# Patient Record
Sex: Male | Born: 1935 | Race: Black or African American | Hispanic: No | Marital: Married
Health system: Southern US, Community
[De-identification: ages and names within clinical notes are randomized; demographics above are authoritative.]

---

## 2005-07-20 ENCOUNTER — Inpatient Hospital Stay (HOSPITAL_COMMUNITY): Admission: EM | Admit: 2005-07-20 | Discharge: 2005-07-30 | Payer: Self-pay | Admitting: Emergency Medicine

## 2005-07-21 ENCOUNTER — Encounter (INDEPENDENT_AMBULATORY_CARE_PROVIDER_SITE_OTHER): Payer: Self-pay | Admitting: Cardiology

## 2005-10-12 ENCOUNTER — Encounter (INDEPENDENT_AMBULATORY_CARE_PROVIDER_SITE_OTHER): Payer: Self-pay | Admitting: Specialist

## 2005-10-12 ENCOUNTER — Ambulatory Visit (HOSPITAL_COMMUNITY): Admission: RE | Admit: 2005-10-12 | Discharge: 2005-10-12 | Payer: Self-pay | Admitting: Gastroenterology

## 2005-12-12 ENCOUNTER — Encounter: Admission: RE | Admit: 2005-12-12 | Discharge: 2005-12-12 | Payer: Self-pay | Admitting: Internal Medicine

## 2006-03-08 ENCOUNTER — Encounter: Admission: RE | Admit: 2006-03-08 | Discharge: 2006-03-08 | Payer: Self-pay | Admitting: Internal Medicine

## 2006-07-22 ENCOUNTER — Encounter: Payer: Self-pay | Admitting: Orthopedic Surgery

## 2006-08-09 ENCOUNTER — Encounter: Payer: Self-pay | Admitting: Orthopedic Surgery

## 2006-10-26 ENCOUNTER — Emergency Department: Payer: Self-pay | Admitting: Emergency Medicine

## 2006-11-30 ENCOUNTER — Emergency Department (HOSPITAL_COMMUNITY): Admission: EM | Admit: 2006-11-30 | Discharge: 2006-11-30 | Payer: Self-pay | Admitting: Emergency Medicine

## 2006-12-09 ENCOUNTER — Encounter (HOSPITAL_COMMUNITY): Admission: RE | Admit: 2006-12-09 | Discharge: 2007-01-20 | Payer: Self-pay | Admitting: Nephrology

## 2007-01-10 ENCOUNTER — Ambulatory Visit: Payer: Self-pay

## 2007-08-13 ENCOUNTER — Encounter (HOSPITAL_COMMUNITY): Admission: RE | Admit: 2007-08-13 | Discharge: 2007-10-01 | Payer: Self-pay | Admitting: Nephrology

## 2008-04-14 ENCOUNTER — Encounter (HOSPITAL_COMMUNITY): Admission: RE | Admit: 2008-04-14 | Discharge: 2008-07-13 | Payer: Self-pay | Admitting: Nephrology

## 2008-07-13 ENCOUNTER — Encounter (HOSPITAL_COMMUNITY): Admission: RE | Admit: 2008-07-13 | Discharge: 2008-10-11 | Payer: Self-pay | Admitting: Nephrology

## 2008-07-17 ENCOUNTER — Emergency Department: Payer: Self-pay | Admitting: Emergency Medicine

## 2008-10-21 ENCOUNTER — Encounter (HOSPITAL_COMMUNITY): Admission: RE | Admit: 2008-10-21 | Discharge: 2009-01-19 | Payer: Self-pay | Admitting: Nephrology

## 2009-02-24 ENCOUNTER — Encounter (HOSPITAL_COMMUNITY): Admission: RE | Admit: 2009-02-24 | Discharge: 2009-05-12 | Payer: Self-pay | Admitting: Nephrology

## 2009-05-09 ENCOUNTER — Ambulatory Visit: Payer: Self-pay | Admitting: Urology

## 2009-05-17 ENCOUNTER — Ambulatory Visit: Payer: Self-pay | Admitting: Urology

## 2009-06-02 ENCOUNTER — Encounter (HOSPITAL_COMMUNITY): Admission: RE | Admit: 2009-06-02 | Discharge: 2009-08-31 | Payer: Self-pay | Admitting: Nephrology

## 2009-06-08 ENCOUNTER — Ambulatory Visit: Payer: Self-pay | Admitting: Oncology

## 2009-06-10 ENCOUNTER — Ambulatory Visit: Payer: Self-pay | Admitting: Urology

## 2009-06-13 ENCOUNTER — Ambulatory Visit: Payer: Self-pay | Admitting: Oncology

## 2009-06-16 ENCOUNTER — Ambulatory Visit: Payer: Self-pay | Admitting: Oncology

## 2009-07-08 ENCOUNTER — Ambulatory Visit: Payer: Self-pay | Admitting: Oncology

## 2009-08-08 ENCOUNTER — Ambulatory Visit: Payer: Self-pay | Admitting: Oncology

## 2009-09-02 ENCOUNTER — Encounter (HOSPITAL_COMMUNITY): Admission: RE | Admit: 2009-09-02 | Discharge: 2009-12-01 | Payer: Self-pay | Admitting: Nephrology

## 2009-09-08 ENCOUNTER — Ambulatory Visit: Payer: Self-pay | Admitting: Oncology

## 2009-09-27 ENCOUNTER — Ambulatory Visit: Payer: Self-pay | Admitting: Oncology

## 2009-10-08 ENCOUNTER — Ambulatory Visit: Payer: Self-pay | Admitting: Oncology

## 2009-11-08 ENCOUNTER — Ambulatory Visit: Payer: Self-pay | Admitting: Oncology

## 2009-12-02 ENCOUNTER — Encounter (HOSPITAL_COMMUNITY)
Admission: RE | Admit: 2009-12-02 | Discharge: 2010-02-07 | Payer: Self-pay | Source: Home / Self Care | Attending: Nephrology | Admitting: Nephrology

## 2009-12-08 ENCOUNTER — Ambulatory Visit: Payer: Self-pay | Admitting: Oncology

## 2010-01-08 ENCOUNTER — Ambulatory Visit: Payer: Self-pay | Admitting: Oncology

## 2010-01-13 LAB — POCT HEMOGLOBIN-HEMACUE
Hemoglobin: 8.8 g/dL — ABNORMAL LOW (ref 13.0–17.0)
Hemoglobin: 8.8 g/dL — ABNORMAL LOW (ref 13.0–17.0)

## 2010-01-13 LAB — FERRITIN: Ferritin: 1294 ng/mL — ABNORMAL HIGH (ref 22–322)

## 2010-01-28 ENCOUNTER — Encounter: Payer: Self-pay | Admitting: Gastroenterology

## 2010-01-31 LAB — POCT HEMOGLOBIN-HEMACUE: Hemoglobin: 8.8 g/dL — ABNORMAL LOW (ref 13.0–17.0)

## 2010-02-03 LAB — POCT HEMOGLOBIN-HEMACUE: Hemoglobin: 9.7 g/dL — ABNORMAL LOW (ref 13.0–17.0)

## 2010-02-03 LAB — IRON AND TIBC
Iron: 53 ug/dL (ref 42–135)
Saturation Ratios: 27 % (ref 20–55)
TIBC: 193 ug/dL — ABNORMAL LOW (ref 215–435)
UIBC: 140 ug/dL

## 2010-02-07 LAB — FERRITIN: Ferritin: 1222 ng/mL — ABNORMAL HIGH (ref 22–322)

## 2010-02-08 ENCOUNTER — Ambulatory Visit: Payer: Self-pay | Admitting: Oncology

## 2010-02-10 ENCOUNTER — Encounter (HOSPITAL_COMMUNITY): Payer: Medicare Other | Attending: Nephrology

## 2010-02-10 ENCOUNTER — Other Ambulatory Visit: Payer: Self-pay

## 2010-02-10 DIAGNOSIS — N183 Chronic kidney disease, stage 3 unspecified: Secondary | ICD-10-CM | POA: Insufficient documentation

## 2010-02-10 DIAGNOSIS — D638 Anemia in other chronic diseases classified elsewhere: Secondary | ICD-10-CM | POA: Insufficient documentation

## 2010-02-16 LAB — POCT HEMOGLOBIN-HEMACUE: Hemoglobin: 9.7 g/dL — ABNORMAL LOW (ref 13.0–17.0)

## 2010-02-17 ENCOUNTER — Other Ambulatory Visit: Payer: Self-pay

## 2010-02-17 ENCOUNTER — Encounter (HOSPITAL_COMMUNITY): Payer: MEDICARE

## 2010-02-23 LAB — POCT HEMOGLOBIN-HEMACUE: Hemoglobin: 10.8 g/dL — ABNORMAL LOW (ref 13.0–17.0)

## 2010-02-24 ENCOUNTER — Other Ambulatory Visit: Payer: Self-pay

## 2010-02-24 ENCOUNTER — Encounter (HOSPITAL_COMMUNITY): Payer: Medicare Other

## 2010-02-27 LAB — POCT HEMOGLOBIN-HEMACUE: Hemoglobin: 11.4 g/dL — ABNORMAL LOW (ref 13.0–17.0)

## 2010-03-03 ENCOUNTER — Other Ambulatory Visit: Payer: Self-pay | Admitting: Nephrology

## 2010-03-03 ENCOUNTER — Encounter (HOSPITAL_COMMUNITY): Payer: Medicare Other

## 2010-03-03 ENCOUNTER — Other Ambulatory Visit: Payer: Self-pay

## 2010-03-03 LAB — IRON AND TIBC
Iron: 49 ug/dL (ref 42–135)
Saturation Ratios: 24 % (ref 20–55)
TIBC: 202 ug/dL — ABNORMAL LOW (ref 215–435)
UIBC: 153 ug/dL

## 2010-03-06 LAB — POCT HEMOGLOBIN-HEMACUE: Hemoglobin: 11.5 g/dL — ABNORMAL LOW (ref 13.0–17.0)

## 2010-03-09 ENCOUNTER — Encounter (HOSPITAL_COMMUNITY): Payer: Medicare Other | Attending: Nephrology

## 2010-03-09 ENCOUNTER — Other Ambulatory Visit: Payer: Self-pay

## 2010-03-09 DIAGNOSIS — N183 Chronic kidney disease, stage 3 unspecified: Secondary | ICD-10-CM | POA: Insufficient documentation

## 2010-03-09 DIAGNOSIS — D638 Anemia in other chronic diseases classified elsewhere: Secondary | ICD-10-CM | POA: Insufficient documentation

## 2010-03-17 ENCOUNTER — Encounter (HOSPITAL_COMMUNITY): Payer: Medicare Other

## 2010-03-17 ENCOUNTER — Other Ambulatory Visit: Payer: Self-pay

## 2010-03-20 LAB — IRON AND TIBC
Iron: 67 ug/dL (ref 42–135)
Saturation Ratios: 34 % (ref 20–55)
UIBC: 132 ug/dL

## 2010-03-20 LAB — POCT HEMOGLOBIN-HEMACUE: Hemoglobin: 9.9 g/dL — ABNORMAL LOW (ref 13.0–17.0)

## 2010-03-21 LAB — IRON AND TIBC
Saturation Ratios: 52 % (ref 20–55)
UIBC: 97 ug/dL

## 2010-03-22 LAB — POCT HEMOGLOBIN-HEMACUE
Hemoglobin: 11.9 g/dL — ABNORMAL LOW (ref 13.0–17.0)
Hemoglobin: 13.2 g/dL (ref 13.0–17.0)

## 2010-03-23 LAB — POCT HEMOGLOBIN-HEMACUE
Hemoglobin: 11.2 g/dL — ABNORMAL LOW (ref 13.0–17.0)
Hemoglobin: 7.8 g/dL — ABNORMAL LOW (ref 13.0–17.0)
Hemoglobin: 8.7 g/dL — ABNORMAL LOW (ref 13.0–17.0)
Hemoglobin: 9.1 g/dL — ABNORMAL LOW (ref 13.0–17.0)
Hemoglobin: 9.6 g/dL — ABNORMAL LOW (ref 13.0–17.0)

## 2010-03-23 LAB — RENAL FUNCTION PANEL
CO2: 25 mEq/L (ref 19–32)
Chloride: 106 mEq/L (ref 96–112)
Glucose, Bld: 185 mg/dL — ABNORMAL HIGH (ref 70–99)
Phosphorus: 3.8 mg/dL (ref 2.3–4.6)
Potassium: 4.1 mEq/L (ref 3.5–5.1)
Sodium: 137 mEq/L (ref 135–145)

## 2010-03-23 LAB — IRON AND TIBC: Iron: 102 ug/dL (ref 42–135)

## 2010-03-23 LAB — PTH, INTACT AND CALCIUM: PTH: 118.1 pg/mL — ABNORMAL HIGH (ref 14.0–72.0)

## 2010-03-24 ENCOUNTER — Other Ambulatory Visit: Payer: Self-pay

## 2010-03-24 ENCOUNTER — Encounter (HOSPITAL_COMMUNITY): Payer: Medicare Other

## 2010-03-24 LAB — POCT HEMOGLOBIN-HEMACUE
Hemoglobin: 7.6 g/dL — ABNORMAL LOW (ref 13.0–17.0)
Hemoglobin: 8.7 g/dL — ABNORMAL LOW (ref 13.0–17.0)

## 2010-03-24 LAB — IRON AND TIBC

## 2010-03-26 LAB — POCT HEMOGLOBIN-HEMACUE
Hemoglobin: 8.5 g/dL — ABNORMAL LOW (ref 13.0–17.0)
Hemoglobin: 9.1 g/dL — ABNORMAL LOW (ref 13.0–17.0)

## 2010-03-26 LAB — IRON AND TIBC

## 2010-03-27 LAB — FERRITIN: Ferritin: 903 ng/mL — ABNORMAL HIGH (ref 22–322)

## 2010-03-27 LAB — IRON AND TIBC
Iron: 54 ug/dL (ref 42–135)
Saturation Ratios: 28 % (ref 20–55)
UIBC: 138 ug/dL

## 2010-03-27 LAB — POCT HEMOGLOBIN-HEMACUE: Hemoglobin: 9.6 g/dL — ABNORMAL LOW (ref 13.0–17.0)

## 2010-03-29 LAB — RENAL FUNCTION PANEL
Albumin: 3.7 g/dL (ref 3.5–5.2)
Chloride: 105 mEq/L (ref 96–112)
GFR calc Af Amer: 39 mL/min — ABNORMAL LOW (ref >60)
GFR calc non Af Amer: 32 mL/min — ABNORMAL LOW (ref >60)
Phosphorus: 3.3 mg/dL (ref 2.3–4.6)
Potassium: 3.5 mEq/L (ref 3.5–5.1)
Sodium: 138 mEq/L (ref 135–145)

## 2010-03-29 LAB — PTH, INTACT AND CALCIUM
Calcium, Total (PTH): 7.3 mg/dL — ABNORMAL LOW (ref 8.4–10.5)
PTH: 145 pg/mL — ABNORMAL HIGH (ref 14.0–72.0)

## 2010-03-29 LAB — POCT HEMOGLOBIN-HEMACUE: Hemoglobin: 10 g/dL — ABNORMAL LOW (ref 13.0–17.0)

## 2010-04-02 LAB — POCT HEMOGLOBIN-HEMACUE: Hemoglobin: 9.6 g/dL — ABNORMAL LOW (ref 13.0–17.0)

## 2010-04-07 ENCOUNTER — Other Ambulatory Visit: Payer: Self-pay | Admitting: Nephrology

## 2010-04-07 ENCOUNTER — Encounter (HOSPITAL_COMMUNITY): Payer: Medicare Other

## 2010-04-07 LAB — POCT HEMOGLOBIN-HEMACUE: Hemoglobin: 12.7 g/dL — ABNORMAL LOW (ref 13.0–17.0)

## 2010-04-11 LAB — IRON AND TIBC
Iron: 61 ug/dL (ref 42–135)
UIBC: 157 ug/dL

## 2010-04-12 LAB — IRON AND TIBC
Iron: 56 ug/dL (ref 42–135)
TIBC: 231 ug/dL (ref 215–435)

## 2010-04-12 LAB — POCT HEMOGLOBIN-HEMACUE
Hemoglobin: 11.4 g/dL — ABNORMAL LOW (ref 13.0–17.0)
Hemoglobin: 11.9 g/dL — ABNORMAL LOW (ref 13.0–17.0)

## 2010-04-13 LAB — IRON AND TIBC
Saturation Ratios: 33 % (ref 20–55)
UIBC: 139 ug/dL

## 2010-04-13 LAB — POCT HEMOGLOBIN-HEMACUE
Hemoglobin: 11.4 g/dL — ABNORMAL LOW (ref 13.0–17.0)
Hemoglobin: 11.6 g/dL — ABNORMAL LOW (ref 13.0–17.0)

## 2010-04-14 LAB — FERRITIN: Ferritin: 615 ng/mL — ABNORMAL HIGH (ref 22–322)

## 2010-04-14 LAB — IRON AND TIBC
Iron: 59 ug/dL (ref 42–135)
UIBC: 195 ug/dL

## 2010-04-15 LAB — IRON AND TIBC
Iron: 71 ug/dL (ref 42–135)
Saturation Ratios: 32 % (ref 20–55)
TIBC: 220 ug/dL (ref 215–435)

## 2010-04-15 LAB — RENAL FUNCTION PANEL
BUN: 28 mg/dL — ABNORMAL HIGH (ref 6–23)
Glucose, Bld: 149 mg/dL — ABNORMAL HIGH (ref 70–99)
Phosphorus: 3.5 mg/dL (ref 2.3–4.6)
Potassium: 3.7 mEq/L (ref 3.5–5.1)

## 2010-04-15 LAB — POCT HEMOGLOBIN-HEMACUE
Hemoglobin: 9.3 g/dL — ABNORMAL LOW (ref 13.0–17.0)
Hemoglobin: 9.1 g/dL — ABNORMAL LOW (ref 13.0–17.0)

## 2010-04-16 LAB — IRON AND TIBC
Saturation Ratios: 28 % (ref 20–55)
UIBC: 154 ug/dL

## 2010-04-17 LAB — IRON AND TIBC
Saturation Ratios: 18 % — ABNORMAL LOW (ref 20–55)
UIBC: 173 ug/dL

## 2010-04-17 LAB — POCT HEMOGLOBIN-HEMACUE: Hemoglobin: 11.3 g/dL — ABNORMAL LOW (ref 13.0–17.0)

## 2010-04-18 LAB — POCT HEMOGLOBIN-HEMACUE: Hemoglobin: 10.1 g/dL — ABNORMAL LOW (ref 13.0–17.0)

## 2010-04-18 LAB — IRON AND TIBC: Iron: 34 ug/dL — ABNORMAL LOW (ref 42–135)

## 2010-04-19 LAB — POCT HEMOGLOBIN-HEMACUE
Hemoglobin: 9.7 g/dL — ABNORMAL LOW (ref 13.0–17.0)
Hemoglobin: 9 g/dL — ABNORMAL LOW (ref 13.0–17.0)

## 2010-04-19 LAB — RENAL FUNCTION PANEL
CO2: 22 mEq/L (ref 19–32)
Chloride: 109 mEq/L (ref 96–112)
Glucose, Bld: 152 mg/dL — ABNORMAL HIGH (ref 70–99)
Potassium: 3.7 mEq/L (ref 3.5–5.1)
Sodium: 143 mEq/L (ref 135–145)

## 2010-04-21 ENCOUNTER — Encounter (HOSPITAL_COMMUNITY): Payer: Medicare Other | Attending: Nephrology

## 2010-04-21 ENCOUNTER — Other Ambulatory Visit: Payer: Self-pay | Admitting: Nephrology

## 2010-04-21 DIAGNOSIS — N183 Chronic kidney disease, stage 3 unspecified: Secondary | ICD-10-CM | POA: Insufficient documentation

## 2010-04-21 DIAGNOSIS — D638 Anemia in other chronic diseases classified elsewhere: Secondary | ICD-10-CM | POA: Insufficient documentation

## 2010-04-21 LAB — RENAL FUNCTION PANEL
Albumin: 3.7 g/dL (ref 3.5–5.2)
Phosphorus: 4.7 mg/dL — ABNORMAL HIGH (ref 2.3–4.6)
Potassium: 3.4 mEq/L — ABNORMAL LOW (ref 3.5–5.1)
Sodium: 135 mEq/L (ref 135–145)

## 2010-04-21 LAB — POCT HEMOGLOBIN-HEMACUE: Hemoglobin: 11.1 g/dL — ABNORMAL LOW (ref 13.0–17.0)

## 2010-04-21 LAB — IRON AND TIBC
Iron: 93 ug/dL (ref 42–135)
TIBC: 210 ug/dL — ABNORMAL LOW (ref 215–435)

## 2010-04-24 LAB — PTH, INTACT AND CALCIUM: Calcium, Total (PTH): 8.9 mg/dL (ref 8.4–10.5)

## 2010-05-05 ENCOUNTER — Encounter (HOSPITAL_COMMUNITY): Payer: Medicare Other

## 2010-05-05 ENCOUNTER — Other Ambulatory Visit: Payer: Self-pay | Admitting: Nephrology

## 2010-05-05 LAB — POCT HEMOGLOBIN-HEMACUE: Hemoglobin: 10.1 g/dL — ABNORMAL LOW (ref 13.0–17.0)

## 2010-05-19 ENCOUNTER — Other Ambulatory Visit: Payer: Self-pay | Admitting: Nephrology

## 2010-05-19 ENCOUNTER — Encounter (HOSPITAL_COMMUNITY): Payer: Medicare Other | Attending: Nephrology

## 2010-05-19 DIAGNOSIS — D638 Anemia in other chronic diseases classified elsewhere: Secondary | ICD-10-CM | POA: Insufficient documentation

## 2010-05-19 DIAGNOSIS — N183 Chronic kidney disease, stage 3 unspecified: Secondary | ICD-10-CM | POA: Insufficient documentation

## 2010-05-19 LAB — IRON AND TIBC
Iron: 71 ug/dL (ref 42–135)
Saturation Ratios: 32 % (ref 20–55)
UIBC: 154 ug/dL

## 2010-05-26 ENCOUNTER — Ambulatory Visit: Payer: Self-pay | Admitting: Urology

## 2010-05-26 NOTE — Op Note (Signed)
NAMEGRANTLAND, WANT             ACCOUNT NO.:  [id number]   MEDICAL RECORD NO.:  [id number]          PATIENT TYPE:  AMB   LOCATION:  ENDO                         FACILITY:  [facility]   PHYSICIAN:  Anselmo Rod, M.D.  DATE OF BIRTH:  1934/04/10   DATE OF PROCEDURE:  10/12/2005  DATE OF DISCHARGE:                                 OPERATIVE REPORT   PROCEDURE:  Esophagogastroduodenoscopy with multiple biopsies.   ENDOSCOPIST:  Charna Elizabeth, M.D.   INSTRUMENT USED:  Olympus video panendoscope.   INDICATIONS FOR PROCEDURE:  A 76 year old [place] American male with a  history of iron-deficiency anemia and personal history of prostate cancer  undergoing EGD to rule out peptic ulcer disease, esophagitis, gastritis,  etc.   PREPROCEDURE PREPARATION:  Informed consent was procured from the patient.  The patient was fasted for eight hours prior to the procedure.  The risks  and benefits were discussed with the patient in great detail.   PREPROCEDURE PHYSICAL:  VITAL SIGNS:  The patient had stable vital signs.  NECK:  Supple.  CHEST:  Clear to auscultation.  CARDIAC:  S1 and S2 regular.  ABDOMEN:  Soft with normal bowel sounds.   DESCRIPTION OF PROCEDURE:  The patient was placed in the left lateral  decubitus position and sedated with 25 mcg of fentanyl  and 4 mg of Versed  given intravenously in incremental doses.  Once the patient was adequately  sedated, maintained on low-flow oxygen and continuous cardiac monitoring,  the Olympus video panendoscope was advanced through the mouth piece over the  tongue, into the esophagus under direct vision.  The esophagus was widely  dilated but showed no evidence of ring, stricture, masses, esophagitis, or  Barrett's mucosa.  There was no abnormality noted at the GE junction.  The  scope passed without resistance into the stomach. Extrinsic compression of  the mid body of the stomach was noted.  Exact reason for this is not clear  to me.  A nodule  was biopsied from the antrum.  This may represent a  pancreatic rest.  A mass versus question prominent fold was seen in post  bulbar area.  Multiple biopsies were done.  The small bowel distal to the  bulb appeared normal up to 60 cm.  The patient tolerated the procedure  without complications.  On retroflexion in the high cardia revealed no  abnormalities.   IMPRESSION:  1. Dilated esophagus,?achalasia.  2. Nodule biopsy from the antrum, rule out pancreatic rest.  3. Prominent fold, question mass, biopsied post bulbar area.  4. Extrinsic compression of the main body of the stomach.   RECOMMENDATIONS:  1. CT scan of the abdomen and pelvis is planned.  The patient had a BUN      and creatinine of 30/1.9 no September 15, 2005.  Therefore, he will be gently hydrated prior to the CT scan      that will be done today with p.o. and IV contrast.  2. Proceed with colonoscopy at this time.  Further recommendations will be      made thereafter.  Anselmo Rod, M.D.  Electronically Signed     JNM/MEDQ  D:  10/12/2005  T:  10/13/2005  Job:  [id number]   cc:   Olene Craven, M.D.

## 2010-05-26 NOTE — Consult Note (Signed)
Darrell Rivera, Darrell Rivera             ACCOUNT NO.:  [id number]   MEDICAL RECORD NO.:  [id number]          PATIENT TYPE:  INP   LOCATION:  3701                         FACILITY:  [facility]   PHYSICIAN:  Wilber Bihari. Caryn Section, M.D.   DATE OF BIRTH:  May 16, 1934   DATE OF CONSULTATION:  07/27/2005  DATE OF DISCHARGE:                                   CONSULTATION   Mr. Darrell Rivera is a 76 year old black man admitted on July 20, 2005 with DOE,  weakness, and trace edema. He has been treated with IV Lasix and other blood  pressure medications.  Weight has decreased, but BUN and creatinine have  increased.  Therefore, renal consultation was requested.   He says he has had some kidney trouble in the past.  His primary physician  is Dr. Kathaleen Rivera (in [place], [place]).  He purportedly has sent via fax  records of his past medical history including measurements of serum  creatinine, but I cannot find these in the chart.  Mr. Devincenzi says they  were given to his physician on the night of admission.   PAST MEDICAL HISTORY:  1.  Radical prostatectomy in 1996 with valve in scrotum to allow urine      flow.  2.  Right TKR.  3.  High BP.  4.  Gout.  5.  Type 2 DM.   MEDICATIONS PRIOR TO ADMISSION:  1.  Cardura 5 t.i.d.  2.  Capozide 100/50 q. a.m., 50/25 q. p.m.  3.  Glyburide 5/D.  4.  Toprol-XL 100/D.  5.  Lupron q. 2 months ?mg.  6.  ?Medicine for gout.  7.  Casodex 50/D.   REVIEW OF SYSTEMS:  No fever, no purulent sputum, no angina, no  claudication, no gross hematuria, no renal colic.  He does have DOE.  He  especially noted this when climbing stairs or moving into his new house.  He  also complains of pain in the left medial ankle.   SOCIAL HISTORY:  He was born in [place], [place].  He is a Engineer, structural.  He has a 10 pack-year history of cigarette smoking.  None  in many years.  He does not drink alcohol.  He is a former Naval architect.  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  He and  his wife have been married for 48 years.   FAMILY HISTORY:  Father died at age 32 of kidney failure.  No dialysis.  Mother died at age 58 of blood clot.  He is one of 3 brothers.  One died  of lung cancer.  One has ?Parkinsonism.  He had 3 sisters.  One died of lung  cancer.  One has heart trouble.   CURRENT MEDICATIONS:  Lasix, Captopril, hydrochlorothiazide have been  discontinued, and IV fluids have been ordered.  He is currently on:  1.  Lovenox 40/D.  2.  ASA 81/D.  3.  Protonix 40/D.  4.  Hydralazine 25 q.i.d.  5.  K-Dur 40 b.i.d.  6.  Toprol-XL 100/D.  7.  Cardura 2 b.i.d.  8.  Sliding-scale NovoLog.  9.  Norvasc 10/D.  10. Prednisone 20 b.i.d. (he just started today).  11. Lantus 10 units given x1.  12. Clonidine 0.1 b.i.d.  13. Avelox 400/D.   PHYSICAL EXAMINATION:  GENERAL:  He is an awake, alert, courteous man.  He  has rapid speech.  He sometimes has trouble remembering what he wants to  say.  VITAL SIGNS:  Temperature 97.6.  Pulse 60.  Respirations 20.  Blood pressure  104/56 at 2 p.m. today.  NOSE AND MOUTH:  Pharynx is moist.  Mucous membranes:  No inflammation.  No  exudate.  NECK:  Carotids 2+ bilaterally without bruits.  CHEST:  Clear.  HEART:  No rub.  ABDOMEN:  Scar present in right upper quadrant (hernia repair).  Also has  scar from umbilicus to pubis.  Bladder does not feel full.  GENITOURINARY:  Uncircumcised penis.  Testes are descended bilaterally.  There is a tubular structure about 3 cm long sitting transversely in the  scrotum, which is a urinary valve.  EXTREMITIES:  Scar, right knee.  No edema.  Mild tenderness, left medial  ankle.  No redness.  NEURO:  Right-handed sensation and strength equal and intact.   Hemoglobin 9.4, WBC 14,000, platelets 216,000.               July 13     July 14     July 15     July 16     July 17     July  18    July 19     July 20  CR          1.8         1.8         1.9          2.0         2.3         2.6  3.1         3.2  Weight      20 kg       119 kg      117 kg      114 kg      114 kg  _____  _____      _____   Sodium 136, potassium 5, chloride 101, CO2 of 25, BUN 67, CR 3.2.  Today:  Glucose 164, calcium 7.7, phosphorus 4.6, albumin 2.6, FE/TIBC 12%  saturation.  Renal ultrasound:  Right kidney 11.8 cm, left kidney 11 cm.  No  hydronephrosis noted.  Chest x-ray:  No CHF, ?left lower lobe subtle  density.  CT chest shows only bibasilar atelectasis.   IMPRESSION:  1.  Hypertension (not high now).  2.  Type 2 diabetes mellitus.  3.  S/free radical prostatectomy 11 year ago with urinary valve in      scrotum.  4.  Renal insufficiency with worsening since being in the hospital.  5.  Anemia with decreased FE/TIBC.  6.  ?Gout in the left ankle.   URINALYSIS:  Glucose, ketones, protein, blood, nitrite negative, LE trace, 0-  2 white cells, 0-2 red cells, no casts.   RECOMMEND:  1.  Hold blood pressure meds (already off Captopril and      hydrochlorothiazide), Toprol-XL, hydralazine, Norvasc to be held for      systolic less than 120.  2.  Regular diet with ADA restrictions.  3.  Check postvoid residuals, bladder ultrasound.  May  need GU to see if      postvoid residual is elevated.  No Foley yet.  Need to follow up with      Urology for Lupron.  4.  IV fluids.  Need old records from doctor in [place] SPEP/UPEP check      orthostatic blood pressure changes.  5.  Check ferritin.  If less than 600, give IV iron 6 on prednisone now.      Check uric acid.           ______________________________  Wilber Bihari. Caryn Section, M.D.     RFF/MEDQ  D:  07/27/2005  T:  07/28/2005  Job:  [id number]

## 2010-05-26 NOTE — Op Note (Signed)
Darrell Rivera, Darrell Rivera             ACCOUNT NO.:  [id number]   MEDICAL RECORD NO.:  [id number]          PATIENT TYPE:  AMB   LOCATION:  ENDO                         FACILITY:  [facility]   PHYSICIAN:  Anselmo Rod, M.D.  DATE OF BIRTH:  [birth year]   DATE OF PROCEDURE:  10/12/2005  DATE OF DISCHARGE:                                 OPERATIVE REPORT   PROCEDURE PERFORMED:  Screening colonoscopy.   ENDOSCOPIST:  Anselmo Rod, M.D.   INSTRUMENT USED:  Olympus video colonoscope.   INDICATIONS FOR PROCEDURE:  A 76 year old African-American male with a  history of iron-deficiency anemia and a personal history of prostate cancer  undergoing a screening colonoscopy to rule out colonic polyps, masses, etc.   PRE-PROCEDURE PREPARATION:  Informed consent was procured from the patient.  The patient was fasted for eight hours prior to the procedure and prepped  with Dulcolax pills and a gallon of TriLyte the night prior to the  procedure.  Risks and benefits of the procedure, including a 10% missed rate  of cancer and polyps, were discussed with the patient as well.   PRE-PROCEDURE PHYSICAL EXAMINATION:  VITAL SIGNS:  Stable vital signs.  NECK:  Supple.  CHEST:  Clear to auscultation.  HEART:  S1 and S2.  Regular.  ABDOMEN:  Soft with normal bowel sounds.   DESCRIPTION OF PROCEDURE:  The patient was placed in the left lateral  decubitus position and sedated with an additional 35 mcg of fentanyl and 3.5  mg of Versed, given intravenously in slow, incremental doses.  Once the  patient was adequately sedated and maintained on low flow oxygen and  continuous cardiac monitoring, the Olympus video colonoscope was advanced  from the rectum to the cecum with difficulty.  There was a significant  amount of residual stool in the colon.  Multiple washes were done.  The  appendicle orifices and ileocecal valve were visualized and photographed.  No masses, polyps, erosions, ulcerations, or diverticula  were seen.  Retroflexion in the rectum revealed small internal hemorrhoids.  The patient  tolerated the procedure well without complications.   IMPRESSION:  1. Small nonbleeding internal hemorrhoids, seen on retroflexion.  2. No masses, polyp, or diverticula present.  3. A large amount of residual stool in the colon.  Multiple washings done.      Very small lesions could be missed.   RECOMMENDATIONS:  1. Continue on a high-fiber diet with liberal fluid intake.  2. Repeat colonoscopy in the next 10 years unless the patient develops any      abnormal symptoms in the interim.  3. Further recommendations will be made after the biopsy results and the      CT scan results have been procured.      Anselmo Rod, M.D.  Electronically Signed     JNM/MEDQ  D:  10/12/2005  T:  10/13/2005  Job:  [id number]   cc:   Olene Craven, M.D.

## 2010-05-26 NOTE — H&P (Signed)
NAMERANCE, SMITHSON NO.:  [id number]   MEDICAL RECORD NO.:  [id number]          PATIENT TYPE:  INP   LOCATION:  3701                         FACILITY:  [facility]   PHYSICIAN:  Hettie Holstein, D.O.    DATE OF BIRTH:  Jun 07, 1934   DATE OF ADMISSION:  07/20/2005  DATE OF DISCHARGE:                                HISTORY & PHYSICAL   PRIMARY CARE PHYSICIAN:  Patient is from out of town.  He is from [place], recently located to this area; therefore, he has no physician.  He  does see Dr. Laurian Brim in [place], [place].  The office is closed at  this time, and I cannot obtain records.   Unfortunately, Darrell Rivera is quite a poor historian himself.  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  He was formerly a Naval architect.  They arrived in town on  Sunday.  In any event, he started developing shortness of breath beginning  yesterday.  This began when he would ascend stairs.  This is new to him.  He  states that he has not had a prior history of congestive heart failure or  myocardial infarction.  He does have a prior history of prostate cancer  diagnosed 11 years ago.  He had surgery.  He cannot really describe the  nature of this and is currently on Lupron shots routinely.   He ran out of medications a couple of days ago, and he called his physician  to call in these medications; however, he has not had a chance to refill  these.  He does not know the dosages of these medications, but he is able to  provide some names.   PAST MEDICAL HISTORY:  1.  Longstanding hypertension.  2.  Diabetes, managed with medications.  3.  He has mentioned that he had anemia in the past but cannot provide      further details.  4.  He has had bilateral knee replacements and hernia surgeries before and a      prostate surgery.   MEDICATIONS:  He reports being on Capozide/Captopril/hydrochlorothiazide.  He does not  know the dosage.  He takes Cardura as well as Diabeta.  He  believes this is 2 mg.  In addition, he is on routine Lupron treatments.   ALLERGIES:  No known drug allergies.   SOCIAL HISTORY:  The patient was a former smoker.  He quit about 20 years  ago.  He is a Naval architect.  He has a son who lives in [place].  They have  a home here in [place], [place], which he states has been causing  him a great deal of stress.  He denies alcohol.  His wife is in the area.   FAMILY HISTORY:  Noncontributory.   REVIEW OF SYSTEMS:  He states that he has not been sleeping well.  He is  quite somnolent in the department, and it is very difficult to have answered  questions.  In any event, he reports having  some constipation.  He denies  any urinary complaints at this time.  Swelling in his lower extremities,  which is new.  Otherwise, he denies any chest pain or diaphoresis.   PHYSICAL EXAMINATION:  VITAL SIGNS:  In the emergency department, he was  noted to have blood pressure of 188/76, heart rate in the 50s, going down to  the 40s, temperature 97, O2 saturation 99%.  GENERAL:  Darrell Rivera is quite pleasant, although he is very difficult to  interview, as he is quite somnolent, although pleasant.  He is alert and  oriented when he can be awakened.  HEENT:  Head is normocephalic and atraumatic.  Extraocular muscles are  intact.  NECK:  Soft and nontender.  LUNGS:  His lungs are clear.  He exhibits normal effort. There is no  dullness to percussion.  CARDIOVASCULAR:  Normal S1 and S2 and S3 to auscultation.  ABDOMEN:  Soft, although obese.  There is a midline surgical scar just below  the umbilicus midline.  There is no rebound.  No costovertebral angle  tenderness or suprapubic tenderness or fullness.  EXTREMITIES:  Mild pretibial edema, +1.  Mild pitting.  NEUROLOGIC:  As noted above.  Reveals him to have no focal deficits.  Quite  sleepy but he is alert and oriented x3.  There was  equivocal paradoxical  pulses on examination, although assessment was quite difficult due to his  profound bradycardia and in synchronization with his respirations, this was  quite difficult.   LABORATORY DATA:  Sodium 144, potassium 3.9, BUN 26, creatinine 1.9, glucose  59, CO2 25.  Point-of-care markers were negative.  MCV 102.  BNP 244.   Chest x-ray revealed cardiomegaly with +/- pericardial effusion.  There was  no pulmonary edema.   EKG revealed sinus bradycardia without evidence of ischemic findings.   ASSESSMENT:  1.  Dyspnea, new onset.  2.  New onset, congestive heart failure.  We will rule out acute ischemic      etiology at this time.  3.  Hypertensive urgency is the potential etiology with recent noncompliance      due to access to medications.  4.  Sinus bradycardia.  5.  Possible pericardial effusion on chest x-ray.  We will pursue this      further.  Follow his course.  The emergency department has ordered a CT      scan without contrast due to his elevated creatinine.  Questionable      acute renal failure.  Do not know his baseline.  He is also on an ACE      inhibitor which may be a factor to consider.  6.  History of prostate cancer, status post surgery with Lupron therapy.      His bladder does not appear to be distended at this time, and we will      ask the nursing staff to straight catheterize him on a p.r.n. basis and      follow his urinary output.  7.  Diabetes mellitus:  We will check a hemoglobin A1C.  He was a bit      hypoglycemic on his initial screening laboratory studies; however, he      had a follow-up CBG that was 105.   PLAN:  We are going to admit Darrell Rivera.  I will check a D-dimer at this  time, as he has had a recent long trip and presented with acute shortness of  breath.  Otherwise, he appears to have new  onset of congestive heart  failure, perhaps in the bases if poorly controlled hypertension.  His chest x-ray does not appear  dramatic.  We will place him on prophylactic doses of  low molecular weight heparin at this time.  He is undergoing CT scanning at  present.  We will follow these results.  We will follow him on telemetry.  He will be placed on after low reduction, optimize his blood pressure  control.  We will initiate nitrates and hydralazine.  We will hold his ACE  inhibitor for now, as his creatinine is elevated.  We will check a urine  sodium and creatinine.  Suspect this will help [place] with his volume status.  We will administer a bowel regimen for him as well.      Hettie Holstein, D.O.  Electronically Signed    ESS/MEDQ  D:  07/20/2005  T:  07/20/2005  Job:  [id number]

## 2010-05-26 NOTE — Discharge Summary (Signed)
Darrell Rivera, Darrell Rivera NO.:  [id number]   MEDICAL RECORD NO.:  [id number]          PATIENT TYPE:  INP   LOCATION:  3701                         FACILITY:  [facility]   PHYSICIAN:  Hillery Aldo, M.D.   DATE OF BIRTH:  1934-08-15   DATE OF ADMISSION:  07/20/2005  DATE OF DISCHARGE:  07/30/2005                                 DISCHARGE SUMMARY   PRIMARY CARE PHYSICIAN:  The patient was unassigned on admission, but will  followup with Dr. Barbee Shropshire.   DISCHARGE DIAGNOSES:  1.  Shortness of breath, resolved, thought to be secondary to diastolic      dysfunction and hypertensive urgency.  2.  Hypertensive urgency.  3.  Acute on chronic renal failure with baseline creatinine of 1.9.  4.  Gout.  5.  Diabetes.  6.  Macrocytic anemia.  7.  History of prostate cancer.  8.  Ventricular bigeminy x1 on telemetry monitoring.   DISCHARGE MEDICATIONS:  1.  Aspirin 81 mg daily.  2.  Nu Iron 150 mg daily.  3.  Toprol XL 100 mg daily.  4.  Cardura 2 mg b.i.d.  5.  Magnesium oxide 400 mg daily.  6.  Norvasc 10 mg daily.  7.  Allopurinol 200 mg daily.  8.  Lupron as before.  9.  DiaBeta as before.  10. Nitroglycerin patch 0.4 mg/h daily from 8 a.m. to 8 p.m.   CONSULTATIONS:  Dr. Caryn Section of nephrology.   BRIEF ADMISSION HISTORY OF PRESENT ILLNESS:  The patient is a 76 year old  male who recently moved into the area and who has not yet established  himself with a primary care physician. He presented to the emergency  department secondary to shortness of breath for 24 hours prior to his  presentation.  His shortness of breath seemed to be exacerbated by activity.  He admitted that he ran out of all of his medications a few days prior to  admission and had been unable to get them filled.  He, therefore, became  concerned when he became short of breath and presented to the emergency  department.  He was admitted for further evaluation and workup when it was  found that his blood  pressure was quite high with systolic pressures upwards  of 180.   PROCEDURES AND DIAGNOSTIC STUDIES:  1.  Chest x-ray on 07/20/2005 showed enlargement of the pericardial      silhouette compatible with cardiomegaly and/or pericardial effusion.  2.  CT scan of the chest on 07/20/2005 showed cardiomegaly with no      significant pericardial effusion. There was a question of thoracic and      lumbar syndesmophytes with HLA B27, spondyloarthropathy not excluded.  3.  A 2-D echocardiogram on 07/21/2005 showed normal left ventricular      systolic function with an ejection fraction of 60%.  There was      pseudonormal left ventricular filling with concomitant abnormal      relaxation and increased filling pressure.  There was mild aortic      valvular regurgitation.  The aortic root was at the upper limits of  normal in size.  There was no pericardial effusion and the pericardium      was normal in appearance.  4.  Chest x-ray on 07/24/2005 showed subtle increased density at the left      lung base, suspicious for early infection.  Likely in the superior      segment, left lower lobe there is evidence of increased density on the      lateral view.  Cardiomegaly was seen, but no failure.  5.  One view of the abdomen on 07/24/2005 showed moderate colonic stool with      no dilated bowel loops.  6.  Two views of the left ankle on 07/26/2005 showed soft tissue swelling      and degenerative changes.  No specific evidence of gouty arthritis      within the ankle.  No plain film evidence of joint effusion.  Sclerotic      fibular lesion is most likely a bone island.  7.  Renal ultrasound on 07/26/2005 was unremarkable.  The right kidney was      11.8 cm in length and the left kidney was 11 cm.  8.  Chest x-ray on 07/27/2005 showed cardiomegaly with no acute findings.  9.  Pelvic ultrasound for postvoid residual on 07/28/2005 showed no      significant post void residual.   DISCHARGE  LABORATORY VALUES:  CBC showed a white blood cell count of 9.5,  hemoglobin 9.3, hematocrit 28, platelet count of 347, with an MCV of 102.7.  Sodium 137, potassium 4.0, chloride 104, bicarb 25, BUN 56, creatinine 2.2,  glucose 137, calcium 8.5.   HOSPITAL COURSE BY PROBLEM:  Problem #1:  SHORTNESS OF BREATH.  The patient  was admitted to the hospital; and given his past medical history, there was  some concern that he had congestive heart failure.  Diagnostic workup was  undertaken including 2-D echocardiography with results as noted above.  He  was put on empiric diuretics given his mildly elevated BNP level of 244.  A  2-D echocardiogram was performed as outlined above and results did not show  any evidence of systolic dysfunction.  The shortness of breath was felt to  be congestive heart failure secondary to diastolic dysfunction and  hypertensive urgency.  Management was then focused on controlling his blood  pressure given with agents other than diuretics given his decompensation in  renal function with diuretic therapy.   Problem #2:  RENAL INSUFFICIENCY.  The patient's baseline creatinine was not  known.  His presenting creatinine was elevated at 1.9.  With diuresis this  gradually worsened and his creatine steadily rose to a high of 3.2 prompting  consultation with a nephrologist.  A renal ultrasound was checked given his  history of prostate cancer and was found to be normal with no evidence of  hydronephrosis.  The nephrologist felt that the patient's acute renal  failure was likely due to over diuresis and started him on IV fluid  rehydration which improved his renal function almost to baseline.  His  discharge creatinine was 2.2 with his admission creatinine being 1.9.  His  estimated creatinine clearance is approximately 32.3 mL/min.  He also had  other diagnostic testing done including an SPEP and a UPEP.  The SPEP is  pending at the time of this dictation and the UPEP  appears to be unremarkable.  A nephrologist would be happy to see him in followup if  needed.  His new primary care physician,  Dr. Barbee Shropshire, should refer him to  nephrology, if indicated.   Problem #3:  GOUT.  The patient did have elevation of his uric acid level  and history consistent with gout.  He was initially treated with steroids  which caused improvement in his gout-type pain.  Ankle films were also  obtained which showed some soft tissue swelling, but no specific evidence of  gout.  Given his uric acid level, and overly aggressive diuresis, it was  felt that his ankle pain was, in fact, related to a gout flare.  He was  discharged on allopurinol.   Problem #4:  DIABETES.  The patient's diabetes has been well controlled over  the past 3 months' as evidenced by his hemoglobin A1c of 5.8.  Because of  the use of prednisone to control his gout; however, his CBGs were quite  elevated in the hospital.  His prednisone was discontinued prior to his  discharge home and his CBGs began to trend down.  He was reassured that the  transient elevation of CBGs were related to prednisone and that his overall  control was excellent.  He will be discharged on his usual dose of DiaBeta.   Problem #5:  MACROCYTIC ANEMIA.  The patient had a fairly significant  macrocytosis and low blood counts.  His RBC folate level was found to be  2880 and his B12 level was found to be 581.  Additionally, the patient also  underwent iron studies with a total iron of 35, total iron binding capacity  of 281 and a 12% saturation.  Given this, he was thought to be iron  deficient and put on iron supplementation.  Further workup can be done as an  outpatient.  Additionally, the patient also did have a methylmalonic acid  level drawn.  This was found to be within the normal limits.   Problem #6:  HISTORY OF PROSTATE CANCER.  The patient did have a PSA test  done while in the hospital which was normal at 0.21.    Problem #7:  VENTRICULAR BIGEMINY.  The patient had 1 episode of self-  limited ventricular bigeminy.  He was monitored on telemetry for the course  of his hospitalization and had no further telemetric events.   DISPOSITION:  The patient is discharged home. He has been set up to see Dr.  Barbee Shropshire as an outpatient and an appointment has been made for him at 9 a.m.  on 08/01/2005.  The patient left before his prescriptions could be given to  him, however, so he is missing several of his medications.  He did have his  discharge instructions with the name of the medicines on them, and he was  called and asked to call the unit with his pharmacy number so that his new  medicines could be called in to him.  If he does not call back, Dr. Barbee Shropshire  can reassess his medication list and adjust them if needed.      Hillery Aldo, M.D.  Electronically Signed     CR/MEDQ  D:  07/30/2005  T:  07/30/2005  Job:  [id number]   cc:   Olene Craven, M.D.

## 2010-06-02 ENCOUNTER — Encounter (HOSPITAL_COMMUNITY): Payer: Medicare Other

## 2010-06-02 ENCOUNTER — Other Ambulatory Visit: Payer: Self-pay | Admitting: Nephrology

## 2010-06-04 ENCOUNTER — Emergency Department: Payer: Self-pay | Admitting: Emergency Medicine

## 2010-06-06 ENCOUNTER — Ambulatory Visit: Payer: Self-pay | Admitting: Urology

## 2010-06-16 ENCOUNTER — Other Ambulatory Visit: Payer: Self-pay | Admitting: Nephrology

## 2010-06-16 ENCOUNTER — Encounter (HOSPITAL_COMMUNITY): Payer: Medicare Other | Attending: Nephrology

## 2010-06-16 DIAGNOSIS — D638 Anemia in other chronic diseases classified elsewhere: Secondary | ICD-10-CM | POA: Insufficient documentation

## 2010-06-16 DIAGNOSIS — N183 Chronic kidney disease, stage 3 unspecified: Secondary | ICD-10-CM | POA: Insufficient documentation

## 2010-06-19 LAB — POCT HEMOGLOBIN-HEMACUE: Hemoglobin: 8.3 g/dL — ABNORMAL LOW (ref 13.0–17.0)

## 2010-06-30 ENCOUNTER — Other Ambulatory Visit: Payer: Self-pay | Admitting: Nephrology

## 2010-06-30 ENCOUNTER — Encounter (HOSPITAL_COMMUNITY): Payer: Medicare Other

## 2010-06-30 LAB — IRON AND TIBC
Iron: 62 ug/dL (ref 42–135)
Saturation Ratios: 29 % (ref 20–55)
TIBC: 217 ug/dL (ref 215–435)

## 2010-06-30 LAB — POCT HEMOGLOBIN-HEMACUE: Hemoglobin: 9.4 g/dL — ABNORMAL LOW (ref 13.0–17.0)

## 2010-07-14 ENCOUNTER — Encounter (HOSPITAL_COMMUNITY): Payer: Medicare Other | Attending: Nephrology

## 2010-07-14 ENCOUNTER — Other Ambulatory Visit: Payer: Self-pay | Admitting: Nephrology

## 2010-07-14 DIAGNOSIS — D638 Anemia in other chronic diseases classified elsewhere: Secondary | ICD-10-CM | POA: Insufficient documentation

## 2010-07-14 DIAGNOSIS — N183 Chronic kidney disease, stage 3 unspecified: Secondary | ICD-10-CM | POA: Insufficient documentation

## 2010-07-28 ENCOUNTER — Encounter (HOSPITAL_COMMUNITY): Payer: Medicare Other

## 2010-07-28 ENCOUNTER — Other Ambulatory Visit: Payer: Self-pay | Admitting: Nephrology

## 2010-08-11 ENCOUNTER — Other Ambulatory Visit: Payer: Self-pay | Admitting: Nephrology

## 2010-08-11 ENCOUNTER — Encounter (HOSPITAL_COMMUNITY): Payer: Medicare Other | Attending: Nephrology

## 2010-08-11 DIAGNOSIS — N183 Chronic kidney disease, stage 3 unspecified: Secondary | ICD-10-CM | POA: Insufficient documentation

## 2010-08-11 DIAGNOSIS — D638 Anemia in other chronic diseases classified elsewhere: Secondary | ICD-10-CM | POA: Insufficient documentation

## 2010-08-11 LAB — POCT HEMOGLOBIN-HEMACUE: Hemoglobin: 9.4 g/dL — ABNORMAL LOW (ref 13.0–17.0)

## 2010-08-25 ENCOUNTER — Other Ambulatory Visit: Payer: Self-pay | Admitting: Nephrology

## 2010-08-25 ENCOUNTER — Encounter (HOSPITAL_COMMUNITY): Payer: Medicare Other

## 2010-08-25 LAB — POCT HEMOGLOBIN-HEMACUE: Hemoglobin: 9.6 g/dL — ABNORMAL LOW (ref 13.0–17.0)

## 2010-09-08 ENCOUNTER — Encounter (HOSPITAL_COMMUNITY): Payer: Medicare Other

## 2010-09-08 ENCOUNTER — Other Ambulatory Visit: Payer: Self-pay | Admitting: Nephrology

## 2010-09-12 LAB — POCT HEMOGLOBIN-HEMACUE: Hemoglobin: 9.4 g/dL — ABNORMAL LOW (ref 13.0–17.0)

## 2010-09-22 ENCOUNTER — Other Ambulatory Visit: Payer: Self-pay | Admitting: Nephrology

## 2010-09-22 ENCOUNTER — Encounter (HOSPITAL_COMMUNITY)
Admission: RE | Admit: 2010-09-22 | Discharge: 2010-09-22 | Disposition: A | Discharge status: Home / Self Care | Payer: Medicare Other | Source: Ambulatory Visit | Attending: Nephrology | Admitting: Nephrology

## 2010-09-22 DIAGNOSIS — N183 Chronic kidney disease, stage 3 unspecified: Secondary | ICD-10-CM | POA: Insufficient documentation

## 2010-09-22 DIAGNOSIS — D638 Anemia in other chronic diseases classified elsewhere: Secondary | ICD-10-CM | POA: Insufficient documentation

## 2010-10-06 ENCOUNTER — Encounter (HOSPITAL_COMMUNITY): Payer: Medicare Other

## 2010-10-09 ENCOUNTER — Other Ambulatory Visit: Payer: Self-pay | Admitting: Nephrology

## 2010-10-09 ENCOUNTER — Encounter (HOSPITAL_COMMUNITY): Payer: Medicare Other | Attending: Nephrology

## 2010-10-09 DIAGNOSIS — D638 Anemia in other chronic diseases classified elsewhere: Secondary | ICD-10-CM | POA: Insufficient documentation

## 2010-10-09 DIAGNOSIS — N183 Chronic kidney disease, stage 3 unspecified: Secondary | ICD-10-CM | POA: Insufficient documentation

## 2010-10-09 LAB — IRON AND TIBC
Saturation Ratios: 32 % (ref 20–55)
TIBC: 217 ug/dL (ref 215–435)
UIBC: 147 ug/dL (ref 125–400)

## 2010-10-09 LAB — POCT HEMOGLOBIN-HEMACUE: Hemoglobin: 9.4 g/dL — ABNORMAL LOW (ref 13.0–17.0)

## 2010-10-14 LAB — RENAL FUNCTION PANEL
Albumin: 4.3 g/dL (ref 3.5–5.2)
BUN: 39 mg/dL — ABNORMAL HIGH (ref 6–23)
Potassium: 4.6 mEq/L (ref 3.5–5.3)

## 2010-10-23 ENCOUNTER — Encounter (HOSPITAL_COMMUNITY)
Admission: RE | Admit: 2010-10-23 | Discharge: 2010-10-23 | Payer: Medicare Other | Source: Ambulatory Visit | Attending: Nephrology | Admitting: Nephrology

## 2010-10-23 ENCOUNTER — Other Ambulatory Visit: Payer: Self-pay | Admitting: Nephrology

## 2010-10-23 LAB — POCT HEMOGLOBIN-HEMACUE: Hemoglobin: 11 g/dL — ABNORMAL LOW (ref 13.0–17.0)

## 2010-11-06 ENCOUNTER — Other Ambulatory Visit: Payer: Self-pay | Admitting: Nephrology

## 2010-11-06 ENCOUNTER — Encounter (HOSPITAL_COMMUNITY): Payer: Medicare Other

## 2010-11-06 LAB — IRON AND TIBC: Iron: 85 ug/dL (ref 42–135)

## 2010-11-06 LAB — FERRITIN: Ferritin: 1378 ng/mL — ABNORMAL HIGH (ref 22–322)

## 2010-11-07 LAB — PTH, INTACT AND CALCIUM: PTH: 118.4 pg/mL — ABNORMAL HIGH (ref 14.0–72.0)

## 2010-11-15 ENCOUNTER — Ambulatory Visit: Payer: Self-pay | Admitting: Oncology

## 2010-11-16 ENCOUNTER — Other Ambulatory Visit (HOSPITAL_COMMUNITY): Payer: Self-pay | Admitting: *Deleted

## 2010-11-20 ENCOUNTER — Encounter (HOSPITAL_COMMUNITY): Payer: Medicare Other

## 2010-11-28 ENCOUNTER — Encounter (HOSPITAL_COMMUNITY)
Admission: RE | Admit: 2010-11-28 | Discharge: 2010-11-28 | Disposition: A | Discharge status: Home / Self Care | Payer: Medicare Other | Source: Ambulatory Visit | Attending: Nephrology | Admitting: Nephrology

## 2010-11-28 DIAGNOSIS — N183 Chronic kidney disease, stage 3 unspecified: Secondary | ICD-10-CM | POA: Insufficient documentation

## 2010-11-28 DIAGNOSIS — D638 Anemia in other chronic diseases classified elsewhere: Secondary | ICD-10-CM | POA: Insufficient documentation

## 2010-11-28 LAB — IRON AND TIBC
Saturation Ratios: 48 % (ref 20–55)
TIBC: 215 ug/dL (ref 215–435)
UIBC: 111 ug/dL — ABNORMAL LOW (ref 125–400)

## 2010-11-28 MED ORDER — EPOETIN ALFA 40000 UNIT/ML IJ SOLN
INTRAMUSCULAR | Status: AC
  Filled 2010-11-28: qty 1

## 2010-11-28 MED ORDER — EPOETIN ALFA 10000 UNIT/ML IJ SOLN
40000.0000 [IU] | INTRAMUSCULAR | Status: DC
  Administered 2010-11-28: 40000 [IU] via SUBCUTANEOUS

## 2010-11-30 ENCOUNTER — Emergency Department: Payer: Self-pay | Admitting: *Deleted

## 2010-12-04 ENCOUNTER — Inpatient Hospital Stay: Payer: Self-pay | Admitting: Internal Medicine

## 2010-12-04 DIAGNOSIS — Z01810 Encounter for preprocedural cardiovascular examination: Secondary | ICD-10-CM

## 2010-12-08 DIAGNOSIS — R079 Chest pain, unspecified: Secondary | ICD-10-CM

## 2010-12-09 ENCOUNTER — Ambulatory Visit: Payer: Self-pay | Admitting: Oncology

## 2010-12-11 ENCOUNTER — Inpatient Hospital Stay (HOSPITAL_COMMUNITY): Admission: RE | Admit: 2010-12-11 | Payer: Medicare Other | Source: Ambulatory Visit

## 2010-12-11 LAB — CEA: CEA: 28.5 ng/mL — ABNORMAL HIGH (ref 0.0–4.7)

## 2010-12-11 LAB — AFP TUMOR MARKER: AFP-Tumor Marker: 3.2 ng/mL (ref 0.0–8.3)

## 2010-12-11 LAB — CANCER ANTIGEN 19-9: CA 19-9: <1 U/mL (ref 0–35)

## 2010-12-25 ENCOUNTER — Encounter (HOSPITAL_COMMUNITY): Payer: Medicare Other

## 2011-01-02 ENCOUNTER — Inpatient Hospital Stay: Payer: Self-pay | Admitting: Oncology

## 2011-01-09 ENCOUNTER — Ambulatory Visit: Payer: Self-pay | Admitting: Oncology

## 2011-01-11 LAB — CBC CANCER CENTER
Basophil #: 0.1 x10 3/mm (ref 0.0–0.1)
Eosinophil #: 0 x10 3/mm (ref 0.0–0.7)
HCT: 28.6 % — ABNORMAL LOW (ref 40.0–52.0)
HGB: 9.6 g/dL — ABNORMAL LOW (ref 13.0–18.0)
Lymphocyte #: 0.5 x10 3/mm — ABNORMAL LOW (ref 1.0–3.6)
MCH: 34.6 pg — ABNORMAL HIGH (ref 26.0–34.0)
MCHC: 33.6 g/dL (ref 32.0–36.0)
Monocyte #: 1 x10 3/mm — ABNORMAL HIGH (ref 0.0–0.7)
Neutrophil %: 84.4 %
Platelet: 180 x10 3/mm (ref 150–440)
RBC: 2.77 10*6/uL — ABNORMAL LOW (ref 4.40–5.90)
RDW: 21.3 % — ABNORMAL HIGH (ref 11.5–14.5)

## 2011-01-11 LAB — RAPID INFLUENZA A&B ANTIGENS

## 2011-01-22 LAB — CBC CANCER CENTER
Eosinophil #: 0 x10 3/mm (ref 0.0–0.7)
Eosinophil %: 0.3 %
Lymphocyte #: 0.9 x10 3/mm — ABNORMAL LOW (ref 1.0–3.6)
MCH: 35.6 pg — ABNORMAL HIGH (ref 26.0–34.0)
MCHC: 34.3 g/dL (ref 32.0–36.0)
MCV: 104 fL — ABNORMAL HIGH (ref 80–100)
Monocyte #: 1.2 x10 3/mm — ABNORMAL HIGH (ref 0.0–0.7)
Neutrophil %: 77.4 %
Platelet: 289 x10 3/mm (ref 150–440)
RBC: 2.53 10*6/uL — ABNORMAL LOW (ref 4.40–5.90)

## 2011-01-22 LAB — COMPREHENSIVE METABOLIC PANEL
Albumin: 2.3 g/dL — ABNORMAL LOW (ref 3.4–5.0)
BUN: 22 mg/dL — ABNORMAL HIGH (ref 7–18)
Bilirubin,Total: 1.1 mg/dL — ABNORMAL HIGH (ref 0.2–1.0)
Chloride: 95 mmol/L — ABNORMAL LOW (ref 98–107)
Creatinine: 2.55 mg/dL — ABNORMAL HIGH (ref 0.60–1.30)
Osmolality: 273 (ref 275–301)
Potassium: 3.4 mmol/L — ABNORMAL LOW (ref 3.5–5.1)
SGPT (ALT): 47 U/L
Total Protein: 6.6 g/dL (ref 6.4–8.2)

## 2011-02-01 LAB — COMPREHENSIVE METABOLIC PANEL
Albumin: 2.4 g/dL — ABNORMAL LOW (ref 3.4–5.0)
Alkaline Phosphatase: 305 U/L — ABNORMAL HIGH (ref 50–136)
BUN: 17 mg/dL (ref 7–18)
Bilirubin,Total: 2.1 mg/dL — ABNORMAL HIGH (ref 0.2–1.0)
Calcium, Total: 8.7 mg/dL (ref 8.5–10.1)
Co2: 27 mmol/L (ref 21–32)
Creatinine: 2.07 mg/dL — ABNORMAL HIGH (ref 0.60–1.30)
EGFR (Non-African Amer.): 34 — ABNORMAL LOW
Osmolality: 276 (ref 275–301)
SGPT (ALT): 94 U/L — ABNORMAL HIGH
Sodium: 135 mmol/L — ABNORMAL LOW (ref 136–145)

## 2011-02-01 LAB — CBC CANCER CENTER
Basophil #: 0 x10 3/mm (ref 0.0–0.1)
Basophil %: 0 %
Eosinophil #: 0.1 x10 3/mm (ref 0.0–0.7)
Eosinophil %: 1.2 %
HCT: 25.6 % — ABNORMAL LOW (ref 40.0–52.0)
HGB: 8.7 g/dL — ABNORMAL LOW (ref 13.0–18.0)
Lymphocyte %: 8.4 %
Monocyte %: 8.1 %
Neutrophil #: 6.9 x10 3/mm — ABNORMAL HIGH (ref 1.4–6.5)
Neutrophil %: 82.3 %
RBC: 2.42 10*6/uL — ABNORMAL LOW (ref 4.40–5.90)
WBC: 8.4 x10 3/mm (ref 3.8–10.6)

## 2011-02-02 LAB — CEA: CEA: 41.6 ng/mL — ABNORMAL HIGH (ref 0.0–4.7)

## 2011-02-09 ENCOUNTER — Ambulatory Visit: Payer: Self-pay | Admitting: Oncology

## 2011-02-15 LAB — CBC CANCER CENTER
Basophil #: 0 x10 3/mm (ref 0.0–0.1)
Basophil %: 0 %
Eosinophil #: 0 x10 3/mm (ref 0.0–0.7)
Eosinophil %: 1.1 %
HGB: 7.2 g/dL — ABNORMAL LOW (ref 13.0–18.0)
Lymphocyte %: 83.3 %
MCHC: 33.5 g/dL (ref 32.0–36.0)
Neutrophil %: 8.5 %
RBC: 2.01 10*6/uL — ABNORMAL LOW (ref 4.40–5.90)

## 2011-02-15 LAB — MAGNESIUM: Magnesium: 2.2 mg/dL

## 2011-02-15 LAB — BASIC METABOLIC PANEL
Anion Gap: 16 (ref 7–16)
Chloride: 100 mmol/L (ref 98–107)
Co2: 19 mmol/L — ABNORMAL LOW (ref 21–32)
Creatinine: 2.98 mg/dL — ABNORMAL HIGH (ref 0.60–1.30)
Osmolality: 293 (ref 275–301)

## 2011-02-22 LAB — CBC CANCER CENTER
Basophil #: 0 x10 3/mm (ref 0.0–0.1)
HCT: 24.6 % — ABNORMAL LOW (ref 40.0–52.0)
Lymphocyte #: 0.4 x10 3/mm — ABNORMAL LOW (ref 1.0–3.6)
MCH: 34.8 pg — ABNORMAL HIGH (ref 26.0–34.0)
MCHC: 34.6 g/dL (ref 32.0–36.0)
MCV: 101 fL — ABNORMAL HIGH (ref 80–100)
Monocyte #: 0.1 x10 3/mm (ref 0.0–0.7)
RDW: 24.1 % — ABNORMAL HIGH (ref 11.5–14.5)
Segmented Neutrophils: 37 %

## 2011-02-22 LAB — COMPREHENSIVE METABOLIC PANEL
Albumin: 2.2 g/dL — ABNORMAL LOW (ref 3.4–5.0)
BUN: 33 mg/dL — ABNORMAL HIGH (ref 7–18)
Bilirubin,Total: 1.4 mg/dL — ABNORMAL HIGH (ref 0.2–1.0)
Chloride: 107 mmol/L (ref 98–107)
Creatinine: 2.19 mg/dL — ABNORMAL HIGH (ref 0.60–1.30)
EGFR (African American): 38 — ABNORMAL LOW
Glucose: 128 mg/dL — ABNORMAL HIGH (ref 65–99)
SGPT (ALT): 52 U/L
Total Protein: 6.5 g/dL (ref 6.4–8.2)

## 2011-03-01 LAB — COMPREHENSIVE METABOLIC PANEL
Alkaline Phosphatase: 230 U/L — ABNORMAL HIGH (ref 50–136)
Calcium, Total: 7.5 mg/dL — ABNORMAL LOW (ref 8.5–10.1)
EGFR (African American): 41 — ABNORMAL LOW
EGFR (Non-African Amer.): 34 — ABNORMAL LOW
Glucose: 169 mg/dL — ABNORMAL HIGH (ref 65–99)
SGOT(AST): 52 U/L — ABNORMAL HIGH (ref 15–37)
SGPT (ALT): 32 U/L

## 2011-03-01 LAB — CBC CANCER CENTER
Basophil #: 0 x10 3/mm (ref 0.0–0.1)
Eosinophil %: 0 %
HCT: 25.9 % — ABNORMAL LOW (ref 40.0–52.0)
Lymphocyte #: 0.7 x10 3/mm — ABNORMAL LOW (ref 1.0–3.6)
MCV: 101 fL — ABNORMAL HIGH (ref 80–100)
Monocyte %: 15.8 %
Neutrophil #: 6.3 x10 3/mm (ref 1.4–6.5)
RDW: 23.5 % — ABNORMAL HIGH (ref 11.5–14.5)
WBC: 8.3 x10 3/mm (ref 3.8–10.6)

## 2011-03-02 LAB — CEA: CEA: 45.3 ng/mL — ABNORMAL HIGH (ref 0.0–4.7)

## 2011-03-09 ENCOUNTER — Ambulatory Visit: Payer: Self-pay | Admitting: Oncology

## 2011-03-15 LAB — CBC CANCER CENTER
Basophil #: 0 x10 3/mm (ref 0.0–0.1)
Basophil %: 0.4 %
Eosinophil #: 0 x10 3/mm (ref 0.0–0.7)
HCT: 24.5 % — ABNORMAL LOW (ref 40.0–52.0)
HGB: 8.4 g/dL — ABNORMAL LOW (ref 13.0–18.0)
Lymphocyte #: 0.7 x10 3/mm — ABNORMAL LOW (ref 1.0–3.6)
Lymphocyte %: 7.6 %
MCHC: 34.2 g/dL (ref 32.0–36.0)
MCV: 105 fL — ABNORMAL HIGH (ref 80–100)
Monocyte #: 0.9 x10 3/mm — ABNORMAL HIGH (ref 0.0–0.7)
Monocyte %: 9.2 %
Neutrophil #: 7.7 x10 3/mm — ABNORMAL HIGH (ref 1.4–6.5)
RDW: 25.7 % — ABNORMAL HIGH (ref 11.5–14.5)

## 2011-03-15 LAB — COMPREHENSIVE METABOLIC PANEL
Anion Gap: 8 (ref 7–16)
Bilirubin,Total: 1 mg/dL (ref 0.2–1.0)
Calcium, Total: 8.4 mg/dL — ABNORMAL LOW (ref 8.5–10.1)
Chloride: 102 mmol/L (ref 98–107)
Co2: 29 mmol/L (ref 21–32)
Creatinine: 2.17 mg/dL — ABNORMAL HIGH (ref 0.60–1.30)
EGFR (African American): 38 — ABNORMAL LOW
Osmolality: 284 (ref 275–301)
Potassium: 4.2 mmol/L (ref 3.5–5.1)
Sodium: 139 mmol/L (ref 136–145)

## 2011-03-28 LAB — CBC CANCER CENTER
Basophil %: 0.3 %
Eosinophil #: 0.1 x10 3/mm (ref 0.0–0.7)
Eosinophil %: 1 %
Lymphocyte #: 0.4 x10 3/mm — ABNORMAL LOW (ref 1.0–3.6)
Monocyte #: 0.5 x10 3/mm (ref 0.0–0.7)
Monocyte %: 7.8 %
Neutrophil #: 4.9 x10 3/mm (ref 1.4–6.5)
Neutrophil %: 83.7 %
Platelet: 135 x10 3/mm — ABNORMAL LOW (ref 150–440)
RBC: 2.07 10*6/uL — ABNORMAL LOW (ref 4.40–5.90)
RDW: 25.8 % — ABNORMAL HIGH (ref 11.5–14.5)
WBC: 5.9 x10 3/mm (ref 3.8–10.6)

## 2011-03-28 LAB — COMPREHENSIVE METABOLIC PANEL
Albumin: 2.5 g/dL — ABNORMAL LOW (ref 3.4–5.0)
Alkaline Phosphatase: 224 U/L — ABNORMAL HIGH (ref 50–136)
BUN: 21 mg/dL — ABNORMAL HIGH (ref 7–18)
EGFR (Non-African Amer.): 31 — ABNORMAL LOW
Glucose: 164 mg/dL — ABNORMAL HIGH (ref 65–99)
Osmolality: 282 (ref 275–301)
Potassium: 4.4 mmol/L (ref 3.5–5.1)
SGOT(AST): 137 U/L — ABNORMAL HIGH (ref 15–37)
Sodium: 138 mmol/L (ref 136–145)

## 2011-04-09 ENCOUNTER — Ambulatory Visit: Payer: Self-pay | Admitting: Oncology

## 2011-05-09 DEATH — deceased

## 2011-12-19 IMAGING — CT NM PET METABOLIC BRAIN
5 series · 25 of 25 positions shown · non-contrast
Comparison: none

REASON FOR EXAM: h/o small cell cancer of prostate. high cea
COMMENTS:

PROCEDURE:     PET - PET/CT METABOLIC EVALUATION  - December 15, 2010 [DATE]
RESULT:
HISTORY: Small cell cancer of prostate. High CEA.

[Series 3: ct wb 3.0 b30f · axial · 3.0mm · 0.98mm/px · z∈[-1631,-645]mm · 10 of 493 slices shown]
[im 1/493]
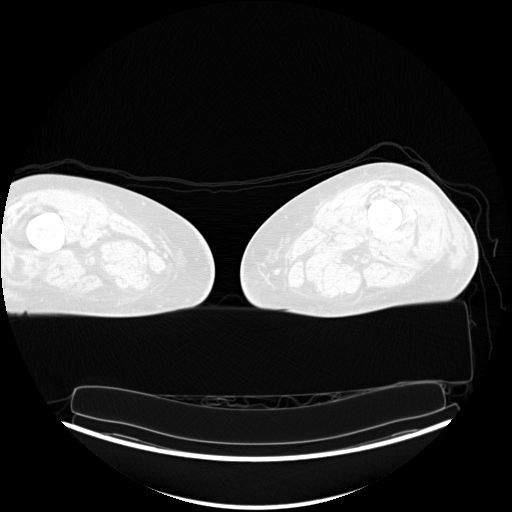
[im 55/493]
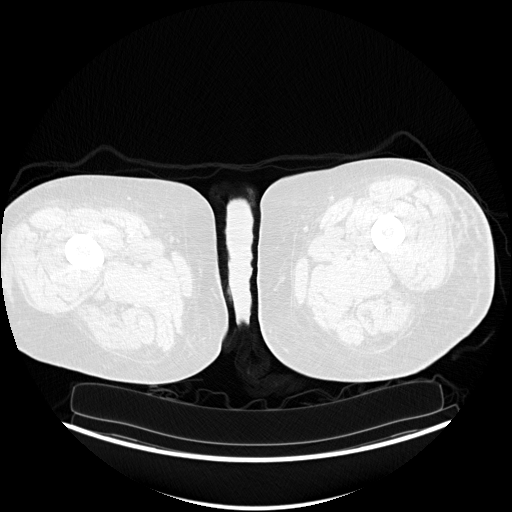
[im 110/493]
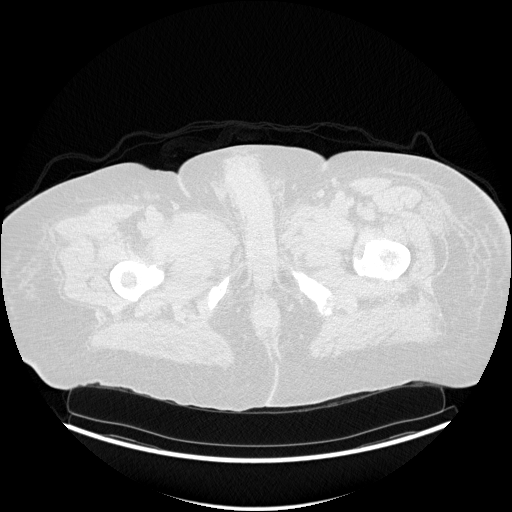
[im 165/493]
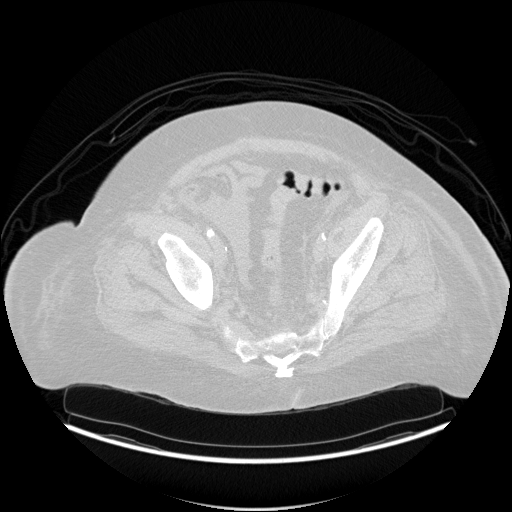
[im 219/493]
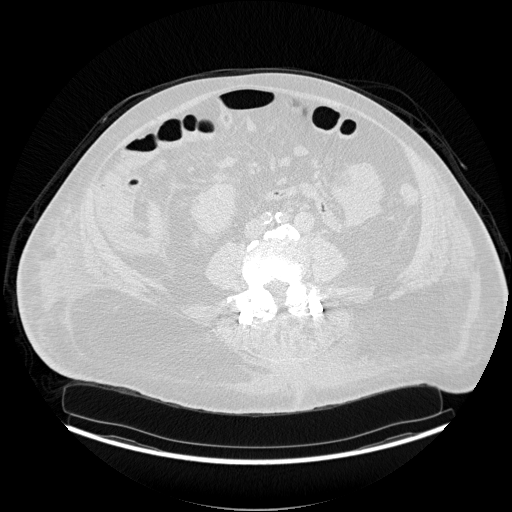
[im 274/493]
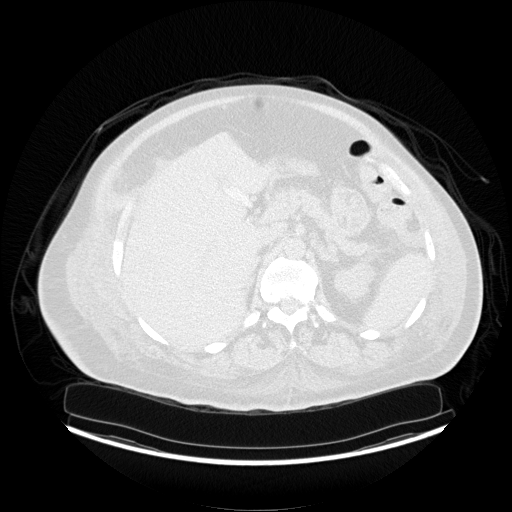
[im 329/493]
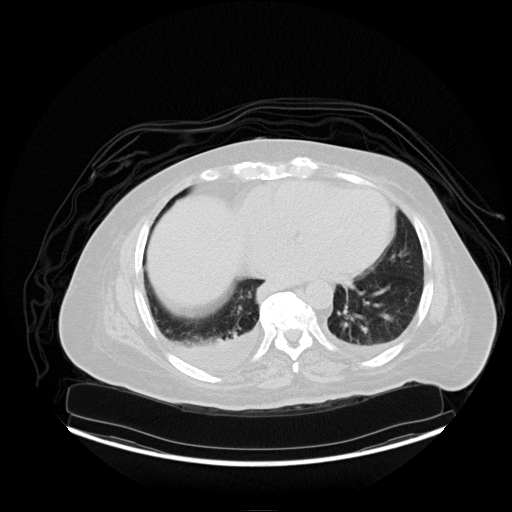
[im 383/493]
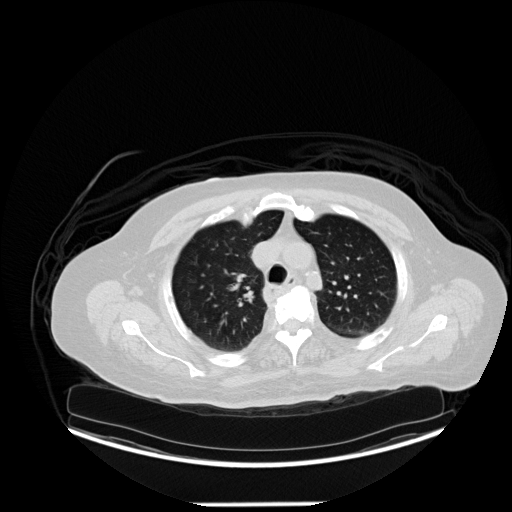
[im 438/493  brain]
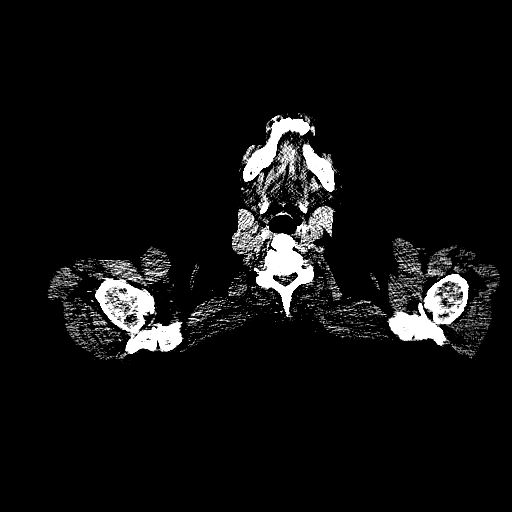
[im 493/493  brain]
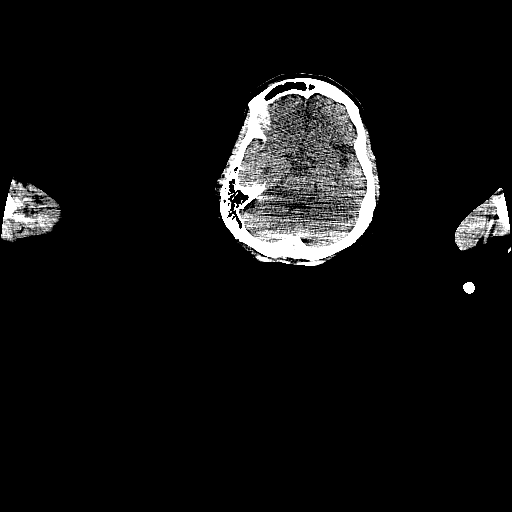

[Series 102: pet wb · axial · 5.0mm · 4.07mm/px · z∈[-1629,-645]mm · 7 of 329 slices shown]
[im 1/329]
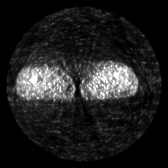
[im 55/329]
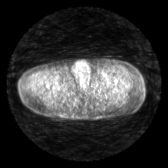
[im 110/329]
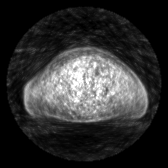
[im 165/329]
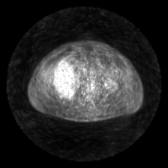
[im 219/329]
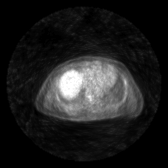
[im 274/329]
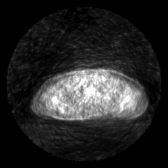
[im 329/329]
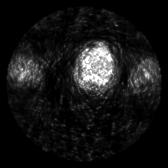

[Series 803: pet axial · 4 of 194 slices shown]
[im 1/194]
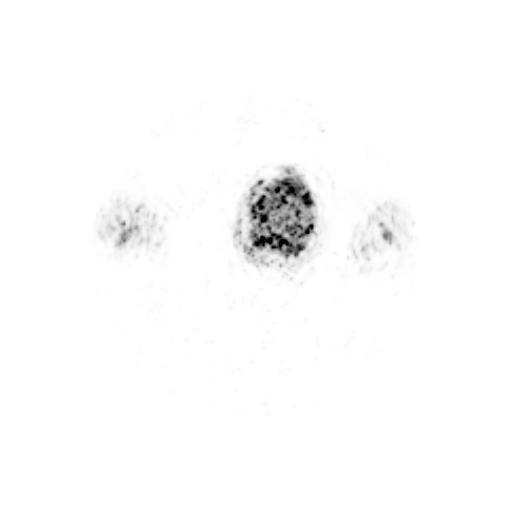
[im 65/194]
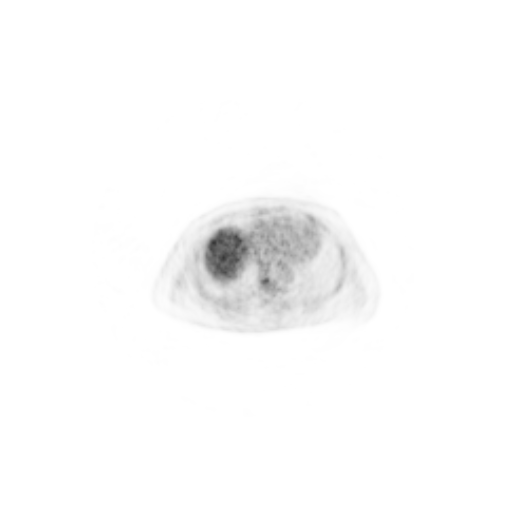
[im 129/194]
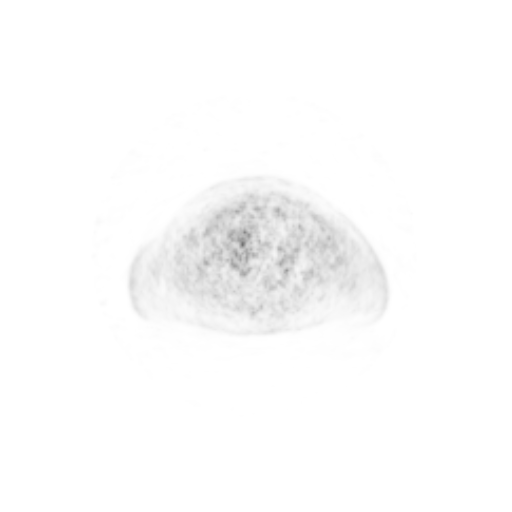
[im 194/194]
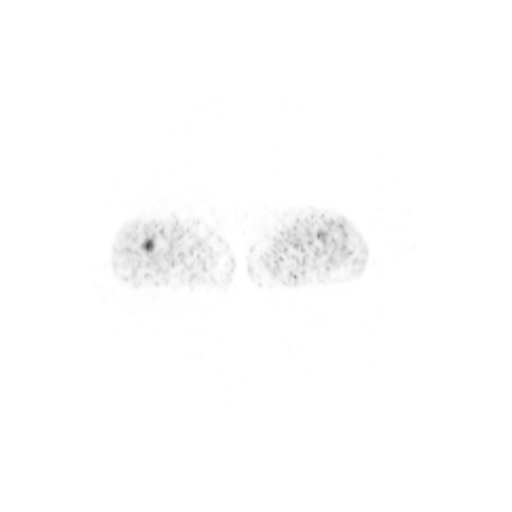

[Series 804: pet coronal · 2 of 75 slices shown]
[im 1/75]
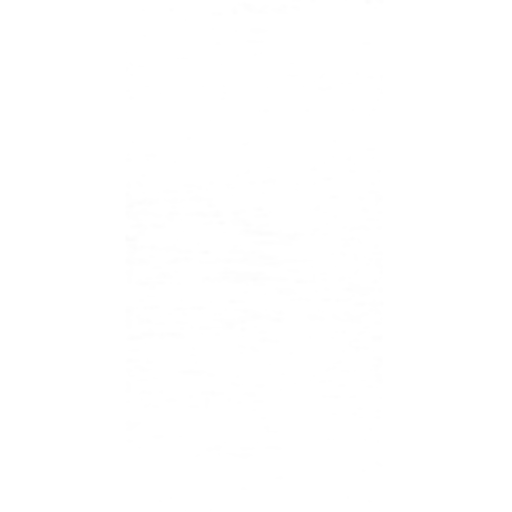
[im 75/75]
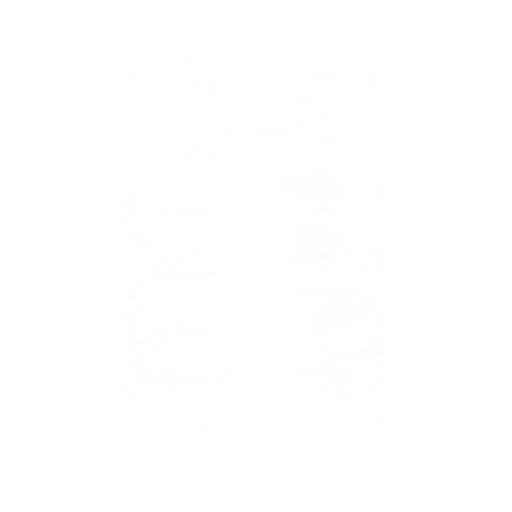

[Series 805: pet sagittal · 2 of 119 slices shown]
[im 1/119]
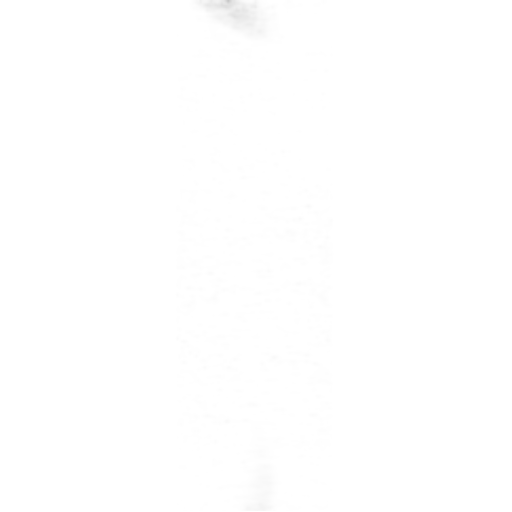
[im 119/119]
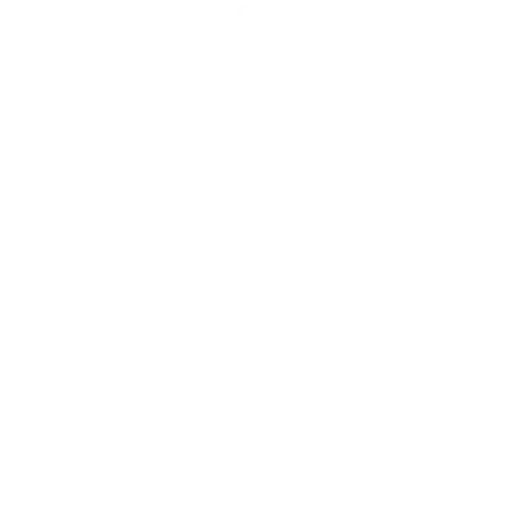

[25 of 25 positions shown; findings below may reference images not displayed]

Comparison Study: No recent.

Procedure and Findings: Following termination of a fasting blood sugar of
143 mg/dl, 13.1 mCi of F-18 FDG was administered to the patient and PET CT
obtained. CT was obtained for attenuation, correction and fusion. Ascites is
noted on CT portion of the exam. The ascites is mild. Right and left small
pleural effusions are present. Coronary artery disease is present.
Gallstones are present. Prostate seeds are noted.

Increased activity is noted throughout the thoracolumbar spine and lower
cervical spine. Metastatic disease to the spine cannot be excluded. Further
evaluation with MRI can be obtained. No other focal PET positive
abnormalities are identified.
IMPRESSION: 1. Cannot exclude metastatic disease to the spine as described above. MRI
may prove useful for further evaluation.
2. CT findings as above. It should be noted that pleural effusions and
ascites is present.

## 2013-11-04 ENCOUNTER — Other Ambulatory Visit: Payer: Self-pay | Admitting: Nurse Practitioner

## 2014-05-02 NOTE — H&P (Signed)
PATIENT NAME:  Darrell Rivera, MIERZWA MR#:  [id number] DATE OF BIRTH:  [birth year]  DATE OF ADMISSION:  12/04/2010  HISTORY OF PRESENT ILLNESS: Patient is a 80 year old black male who was seen in the ER 11/22 with abdominal pain. He was found to have cholelithiasis and a thickened gallbladder wall as well as elevated liver functions. Patient presented to hospitalist surgeon who decided not to admit the patient and have him seen in the office the following week. I was asked by the ER physician to consider seeing the patient in follow up and agreed to see the patient in my office today, the 26th.   Patient complains of attacks of abdominal pain, epigastric, right upper quadrant lasting half a day or more. He has been having more of these lately. Occasional vomiting He vomited once this morning, more of a heaving-type discomfort. His ultrasound showed cholelithiasis and a thickened gallbladder wall and enlarged liver at 17 cm. The liver echotexture was somewhat coarse.   Patient has a history of diabetes for several years.   PAST MEDICAL HISTORY:  1. Prostate cancer removed with surgery probably eight years ago. 2. Back surgery two years ago.   ALLERGIES: Percodan and Percocet.   HABITS: Does not smoke. Does not drink. Does have constipation, takes [place] 3 times a week. Usually moves his bowels 3 or 4 times a week mostly due to the medication.   REVIEW OF SYSTEMS: No weight loss. His appetite has fallen off with his illness. No fevers. No rectal bleeding, no black stools. No diarrhea. Does have a history of some anemia. No dysphagia. No indigestion. No alternating constipation and diarrhea. Has nocturia 3 or 4 times a night. Occasionally has dysuria. Has chest pains that last only a few seconds. He had EKG and troponins in the ER that were negative. Stress and activities seem to bring on the chest pain, only last a few seconds and go away with deep breaths. No glaucoma, cataracts, blurry vision, seizures,  fainting spells or headaches. Patient does have chronic kidney disease with elevated BUN and creatinine when seen in the ER.   LABORATORY, DIAGNOSTIC AND RADIOLOGICAL DATA: In the ER a few days ago his creatinine level was 2.39, AST 252, ALT 325, total bilirubin 1.6. He had two sets of troponin that were negative. EKG showed normal sinus rhythm, minimal voltage criteria for left ventricular hypertrophy. Troponin less than 0.02, CPK-MB 1.8. White count 8.4, hemoglobin 9.3, hematocrit 27.8, MCV 107, MCH 35.9, platelet count 191, glucose 112, BUN 39, creatinine 2.39, sodium 139, potassium 3.3, chloride 99, CO2 30, calcium 10.6, SGOT 252, SGPT 325, alkaline phosphatase 238, albumin 3.1, total bilirubin 1.6. D-dimer slightly elevated at 1.09. Chest x-ray showed no acute cardiopulmonary disease.   PHYSICAL EXAMINATION:  GENERAL: Elderly black male able to get out of the wheelchair and stand and get on the table, requires minimal assistance.   HEENT: Sclerae nonicteric. Conjunctivae negative. Tongue negative. The head is atraumatic.   NECK: No thyromegaly. No carotid bruits.   CHEST: Clear.   HEART: No murmurs or gallops I can hear.   ABDOMEN: Somewhat obese. There is a palpable tenderness and fullness in the right upper quadrant; possibly gallbladder, possibly liver. No splenomegaly.   EXTREMITIES: No significant edema.   RECTAL: Exam not done at this time.   ASSESSMENT: Acute and chronic cholecystitis with cholelithiasis, elevated liver functions, hepatomegaly.   PLAN:  1. Admit patient to the hospital.  2. Get consultation with hospitalist internal medicine and surgeon, Dr.  Renda RollsWilton Smith; very likely needs his gallbladder out.  3. Recommend a CAT scan of the abdomen without IV contrast because of renal insufficiency.  4. Because of elevated liver functions will check ferritin, iron, iron binding, hepatitis A, B, and C.   5. Because of elevated MCV check a B12 and folic acid level.    ____________________________ Scot Junobert T. Elliott, MD rte:cms D: 12/04/2010 13:22:14 ET T: 12/04/2010 13:44:13 ET JOB#: [id number]  cc: Scot Junobert T. Elliott, MD, <Dictator> Scot JunOBERT T ELLIOTT MD ELECTRONICALLY SIGNED 01/18/2011 12:03

## 2014-05-02 NOTE — Consult Note (Signed)
PATIENT NAME:  Darrell Rivera, MITCH MR#:  [id number] DATE OF BIRTH:  October 15, 1934  DATE OF CONSULTATION:  12/04/2010  REFERRING PHYSICIAN: Lynnae Prude, MD   CONSULTING PHYSICIAN:  Fredia Sorrow, MD PRIMARY CARE PHYSICIAN: Andi Devon, MD  ONCOLOGIST:  Johney Maine, MD  NEPHROLOGIST:  Marina Gravel, MD in [place] UROLOGIST: Assunta Gambles, MD    CHIEF COMPLAINT: Abdominal pain.   HISTORY OF PRESENT ILLNESS: The patient is a 80 year old male who has history of diabetes. He has history of carcinoma of the prostate and bladder, small cell component along with adenocarcinoma. He is status post four cycles of chemotherapy, and he is on Lupron therapy right now. He also has a history of hypertension, gout, bilateral knee replacements. He had an artificial sphincter that was removed in May of 2012, and he also had penile prosthesis in the past. He initially presented to the Emergency Room on 11/30/2010 because of abdominal pain. He is complaining of right upper quadrant pain radiating to his back, and he said the hamburgers caused the pain when he ate some hamburgers. In the Emergency Room on 11/30/2010, he was noted to have elevated liver function tests with elevated alkaline phosphatase, ALT and ASTs, and he had an ultrasound of the abdomen done which showed that the patient had cholelithiasis with thickened gallbladder wall, borderline hepatomegaly. He was discharged with outpatient follow-up with Surgery and Dr. Mechele Collin. He was admitted by Dr. Mechele Collin today because of continued abdominal pain. He says that he is having abdominal pain since last week. He is unable to tolerate anything solid. He is drinking fluids, though. He is feeling nauseous but he did not vomit. He denies any fever, any chest pain, any shortness of breath, any diarrhea. He denies any previous problems of gallstones in the past until this last week, so he is being admitted for elevated liver function tests with acute cholecystitis, and  Surgical consultation is pending at this time. He is also complaining of feeling dry. He says his urine is dark brown in color.   PAST MEDICAL HISTORY:  1. Diabetes.  2. Hypertension.  3. Gout.  4. Chronic kidney disease. He follows with Nephrology in [place]. His baseline creatinine runs in the range of 2.69 with a GFR of around 30. The last creatinine on November 22nd was 2.39 with a GFR of around 34, which would make him chronic kidney disease, stage III-IV.   5. Carcinoma of the prostate and bladder, small cell component, along with  adenocarcinoma, status post four cycles of chemotherapy with carboplatin and VP-16, now on Lupron therapy. He recently saw Dr. Doylene Canning on 11/15/2010. He suggested to continue Lupron therapy.  6. History of penile prosthesis in the past 7. Removal of artificial urinary sphincter in May of 2012.  8. Bilateral total knee replacements.  9. Lumbar fusion.  10. In May of 2011, he had transurethral resection of bladder tumor.   ALLERGIES TO MEDICATIONS: Percocet and Percodan.   HOME MEDICATIONS:  As per Dr. Aleda Grana list:  1. Allopurinol 300 mg daily.  2. Glyburide 2.5 mg daily.  3. Metoprolol 100 mg daily.  4. Doxazosin 2 mg b.i.d.  5. Potassium gluconate 550 mg daily.   SOCIAL HISTORY: He lives in [place].  He denies any smoking, alcohol, or drug use.   FAMILY HISTORY: No significant history of cancer in the family.   REVIEW OF SYSTEMS: CONSTITUTIONAL: He denies any fever. HEENT: No acute change in vision. No headache. No dizziness. RESPIRATORY: No cough. No chronic obstructive  pulmonary disease. No dyspnea. No pleuritic chest pain. No hemoptysis. CARDIOVASCULAR: Sometimes he has dyspnea on exertion, and he sometimes has mild chest pains on exertion. No palpitations. No syncope. GASTROINTESTINAL: He is complaining of nausea, vomiting, abdominal pain. No GI bleed. GENITOURINARY: He has some urinary incontinence because artificial sphincter was removed. No  dysuria or frequency. HEMATOLOGIC: He has history of anemia. No bleeding from any site. INTEGUMENT: No rash. ENDOCRINE: No thyroid problems. MUSCULOSKELETAL: No joint pains. No swelling. NEUROLOGICAL: No focal numbness or weakness. PSYCHIATRIC: No anxiety.   PHYSICAL EXAMINATION:  VITAL SIGNS: T-max of 97.4, heart rate of 67, blood pressure 170/72, saturating 96% on room air.   GENERAL: This is an elderly [place] male who is slow to respond, sleepy.   HEENT: Bilateral pupils are equal. Extraocular muscles intact. No scleral icterus. No conjunctivitis. Oral mucosa is pale and dry.   NECK: No thyroid tenderness, enlargement or nodule. Neck is supple. No masses, nontender. No adenopathy. No JVD. No carotid bruits.   CHEST: Bilateral breath sounds are clear. No wheeze. Normal effort. No respiratory distress.   HEART: Heart sounds are regular. No murmur. Peripheral pulses are 1+. No lower extremity edema.   ABDOMEN: There is some right upper quadrant tenderness. No guarding. No rigidity. Normal bowel sounds. No hepatosplenomegaly. No bruit. No masses.   RECTAL: Exam is deferred.   NEUROLOGICAL: He is awake, alert, oriented time, place and person. Poor insight. Cranial nerves are intact. Moving all extremities against gravity.   EXTREMITIES: No cyanosis, no clubbing.   SKIN: No rash. No lesions.   LABORATORY, DIAGNOSTIC AND RADIOLOGICAL DATA:  On 11/30/2010: White count of 8.4, hemoglobin of 9.3, MCV of 107. His last hemoglobin was 8.7, most likely he has anemia of chronic kidney disease.  His iron studies show ferritin of 2245 with TIBC of 209, saturation 31%.  Folic acid is normal at 30.6.  A1c is 5.5.  BMP: Sodium 139, potassium 3.3, BUN 39, creatinine 2.39, glucose is 112, calcium 10.6, alkaline phosphatase 238, ALT of 325, AST of 252 with an albumin of 3.1.  His hepatitis serology is pending.  His EKG shows that he has sinus rhythm at 61, no acute ischemic changes, unchanged  from prior EKG on 11/30/2010.  Chest x-ray on 11/30/2010 was negative.  He had an ultrasound of the abdomen done which showed that the patient had cholelithiasis with thickened gallbladder wall. No sonographic Murphy sign. Some hepatomegaly.  IMPRESSION:  1. Right upper quadrant abdominal pain with elevated liver function tests, cholelithiasis, rule out any choledocholithiasis.  2. Diabetes.  3. Hypertension.  4. Chronic kidney disease, stage III-IV. 5. Anemia of chronic kidney disease.  6. History of prostate and bladder cancer, status post resection, now on Lupron therapy.  7. Gout.   PLAN: The patient is a 80 year old male who has history of diabetes, hypertension, chronic kidney disease, stage III or IV, history of prostate and bladder cancer, now with Lupron therapy, follows with Dr. Doylene Canning and Dr. Achilles Dunk. He is complaining of right upper quadrant pain for about a week now, some nausea and vomiting. He has elevated liver function tests. Ultrasound of the abdomen is suggestive of cholelithiasis and some thickened gallbladder wall. He may have some underlying cholecystitis also. He had elevated liver function tests, so he could have underlying choledocholithiasis also. Also, he has some hepatomegaly with coarse hepatic echotexture. He is admitted by GI today, and he is planned for a CT abdomen/pelvis and a Surgical consultation.   For  his hypertension, he is on beta blockers. I am going to restart his beta blockers perioperatively, place him on sliding scale coverage for his diabetes. I will hold his glyburide because he is on only a clear liquid diet right now, and with his chronic kidney disease, stage III and IV, he has high risk for hypoglycemia with glyburide. Continue his doxazosin. I will increase his fluids because he appears to be dry and dehydrated clinically. His kidney function needs to be monitored. His electrolytes and also his liver function tests need to be monitored. Most likely he  has anemia of chronic kidney disease which is stable. His iron studies suggest anemia of chronic disease. His EKG does not show any acute ischemia. The patient said that [place] he had a stress test and a cardiac catheterization, and that was normal as per the patient. He has no active cardiac symptoms at this time and EKG is normal, but he has multiple risk factors. So he is an intermediate cardiac risk for procedure but can proceed with surgery. We will continue perioperative beta blockers on him. For more optimal control of his blood pressure, Norvasc can also be added to the regimen if his blood pressure is still not controlled. I would avoid any ACE or angiotensin receptor blocker because of his worse renal function.       TIME SPENT:   Time spent with admission and coordination of care was 55 minutes.    ____________________________ Fredia SorrowAbhinav Rayette Mogg, MD ag:cbb D: 12/04/2010 16:54:38 ET T: 12/05/2010 10:43:42 ET JOB#: [id number]  cc: Fredia SorrowAbhinav Diavian Furgason, MD, <Dictator> Andi DevonKimberly Shelton, MD  Scot Junobert T. Elliott, MD   Fredia SorrowABHINAV Kady Toothaker MD ELECTRONICALLY SIGNED 01/13/2011 17:06

## 2014-05-02 NOTE — Discharge Summary (Signed)
PATIENT NAME:  Darrell Rivera, Darrell Rivera MR#:  [id number] DATE OF BIRTH:  February 14, 1934  DATE OF ADMISSION:  01/02/2011 DATE OF DISCHARGE:  01/04/2011  FINAL DIAGNOSES:  1. Fever and myelosuppression secondary to chemotherapy.  2. Hypocalcemia.  3. 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  4. History of carcinoma of prostate.   DISCHARGE MEDICATIONS:  1. Levaquin 250 mg daily for six days. 2. Imdur 60 mg twice a day. 3. Tramadol 50 mg 1 tablet every six hours as needed for pain. 4. Dulcolax suppository as needed.  5. Captopril 50 mg oral tablet once a day. 6. Metoprolol 150 mg once a day. 7. Nexium 40 mg p.o. daily. 8. Allopurinol 300 mg p.o. daily.  9. Glyburide 2.5 mg p.o. daily.  10. Caltrate 600 mg p.o. twice a day. 11. Mag-Ox 400 mg p.o. daily.   DISCHARGE INSTRUCTIONS: The patient was referred to home health agency for further follow-up. The patient was advised to call my office to make an appointment to see me next week after discharge.   DIET: Regular.   ACTIVITY: As tolerated.   HISTORY OF PRESENT ILLNESS: The patient was admitted into the hospital for increasing weakness, fever, and myelosuppression. 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 He had received chemotherapy with carboplatin and VP-16 in the past and recently carboplatin and CPT-11.   He was having increasing weakness. Hemoglobin was 7.3 and white count was 0.5. In view of all of these findings, the patient was admitted in the hospital with diagnosis of myelosuppression and fever.   HOSPITAL COURSE: During the hospital stay, the patient received packed cell transfusion and started on Neupogen. He received IV antibiotics. All the cultures were negative. The patient also had severe hypocalcemia. The patient had a previous history of hypercalcemia and was treated with Zometa. The patient also has an  underlying problem with hypertension, diabetes, and chronic renal failure. Gradually the patient's condition improved, feeling better, ambulating a little bit better. At the time of discharge, he was advised to get follow-up as an outpatient. Overall the patient has a poor prognosis because of underlying medical illnesses.  ____________________________ Gerome SamJanak K. Nichole Neyer, MD jkc:slb D: 01/30/2011 07:48:01 ET T: 01/31/2011 09:54:01 ET JOB#: [id number]  cc: Valarie ConesJanak K. Doylene Canninghoksi, MD, <Dictator> Laddie AquasJANAK K Kejon Feild MD ELECTRONICALLY SIGNED 01/31/2011 13:10

## 2014-05-02 NOTE — Consult Note (Signed)
PATIENT NAME:  Darrell GroomsROBERSON, Darrell E MR#:  [id number] DATE OF BIRTH:  June 09, 1934  DATE OF CONSULTATION:  12/08/2010  REFERRING PHYSICIAN:  Dr. Juliene PinaMody and Dr. Mechele CollinElliott CONSULTING PHYSICIAN:  Dwayne D. Callwood, MD  INDICATION: Chest pain, possible angina   HISTORY OF PRESENT ILLNESS: Mr. Darrell Rivera is a 80 year old African American male seen in the ER, found to abdominal pain with cholelithiasis and thickened gallbladder, elevated liver functions. He was presented to the surgeons who decided to admit the patient and have him seen for follow-up. The patient was seen in the office by gastroenterology. The patient complains of attacks of abdominal pain, epigastric right upper quadrant discomfort. He has been having them on and off recurrently, occasional vomiting. He reportedly had an episode of chest pain and elevated blood pressure and was preop for esophagogastroduodenoscopy and needed cardiology evaluation because of his chronic chest pain symptoms and possible unstable angina.   REVIEW OF SYSTEMS: No blackout spells or syncope. He has had nausea. He has had vomiting. No fever, no chills, no sweats. No weight loss. No weight gain. No hemoptysis or hematemesis. No bright red blood per rectum.   PAST MEDICAL HISTORY:  1. Prostate trouble.  2. Chronic back trouble.  3. Gallbladder disease and possible reflux.  4. Diabetes.   SOCIAL HISTORY: No smoking. No significant alcohol consumption.   FAMILY HISTORY: Noncontributory.   PHYSICAL EXAMINATION:  VITAL SIGNS: Blood pressure 180/90, pulse 80, respiratory rate 16, afebrile.   HEENT: Normocephalic, atraumatic. Pupils equal, reactive to light.   NECK: Supple. No jugular venous distention, bruits or adenopathy.   LUNGS: Clear to auscultation and percussion. No significant wheeze, rhonchi, or rale.   HEART: Regular rate and rhythm. Positive S4. PMI is nondisplaced. Systolic ejection murmur at the apex.   ABDOMEN: Benign.   EXTREMITIES: Exam within  normal limits.   NEUROLOGIC: Examination is intact.   SKIN: Normal.   LABORATORY DATA: Troponin less than 0.02, hemoglobin 9, hematocrit 28, white count 8. CPK was negative. LFTs elevated. SGOT 252, SGPT 325, elevated d-dimer. Chest x-ray was negative. EKG: Normal sinus rhythm, nonspecific findings.   ASSESSMENT:  1. Unstable angina.  2. Angina.  3. Chest pain.  4. Hypertension.  5. Reflux.  6. Gallbladder disease.  7. Hypercalcemia.   PLAN:  1. Rule out for myocardial infarction.  2. Continue cardiac enzymes.  3. Continue EKG. 4. Consider echocardiogram to assess LV function wall motion.  5. Continue Imdur, beta blockers. Aspirin should be fine. Continue morphine for pain.  6. If the patient rules out, then I think he should be an acceptable surgical risk for endoscopy and we will treat the patient conservatively.  ____________________________ Bobbie Stackwayne D. Juliann Paresallwood, MD ddc:ap D: 12/25/2010 09:34:48 ET           T: 12/25/2010 12:17:52 ET                  JOB#: [id number] DWAYNE D CALLWOOD MD ELECTRONICALLY SIGNED 01/16/2011 9:14
